# Patient Record
Sex: Male | Born: 1955 | Race: White | Hispanic: No | Marital: Married | Smoking: Never smoker
Health system: Southern US, Community
[De-identification: ages and names within clinical notes are randomized; demographics above are authoritative.]

## PROBLEM LIST (undated history)

## (undated) DIAGNOSIS — D649 Anemia, unspecified: Secondary | ICD-10-CM

## (undated) DIAGNOSIS — E538 Deficiency of other specified B group vitamins: Secondary | ICD-10-CM

## (undated) DIAGNOSIS — H3530 Unspecified macular degeneration: Secondary | ICD-10-CM

## (undated) DIAGNOSIS — IMO0002 Reserved for concepts with insufficient information to code with codable children: Secondary | ICD-10-CM

## (undated) DIAGNOSIS — T7840XA Allergy, unspecified, initial encounter: Secondary | ICD-10-CM

## (undated) DIAGNOSIS — H04123 Dry eye syndrome of bilateral lacrimal glands: Secondary | ICD-10-CM

## (undated) HISTORY — PX: APPENDECTOMY: SHX54

## (undated) HISTORY — DX: Allergy, unspecified, initial encounter: T78.40XA

## (undated) HISTORY — DX: Dry eye syndrome of bilateral lacrimal glands: H04.123

## (undated) HISTORY — DX: Deficiency of other specified B group vitamins: E53.8

## (undated) HISTORY — DX: Anemia, unspecified: D64.9

## (undated) HISTORY — DX: Reserved for concepts with insufficient information to code with codable children: IMO0002

## (undated) HISTORY — DX: Unspecified macular degeneration: H35.30

---

## 2005-02-27 ENCOUNTER — Ambulatory Visit: Payer: Self-pay | Admitting: Oncology

## 2006-03-31 ENCOUNTER — Ambulatory Visit: Payer: Self-pay | Admitting: Oncology

## 2006-04-05 LAB — CBC WITH DIFFERENTIAL/PLATELET
BASO%: 0.3 % (ref 0.0–2.0)
EOS%: 3 % (ref 0.0–7.0)
LYMPH%: 30.2 % (ref 14.0–48.0)
MCHC: 34.7 g/dL (ref 32.0–35.9)
MONO#: 0.5 10*3/uL (ref 0.1–0.9)
RBC: 5.22 10*6/uL (ref 4.20–5.71)
WBC: 4.3 10*3/uL (ref 4.0–10.0)
lymph#: 1.3 10*3/uL (ref 0.9–3.3)

## 2006-04-05 LAB — COMPREHENSIVE METABOLIC PANEL
AST: 25 U/L (ref 0–37)
Albumin: 4.7 g/dL (ref 3.5–5.2)
Alkaline Phosphatase: 72 U/L (ref 39–117)
BUN: 12 mg/dL (ref 6–23)
Potassium: 5.2 mEq/L (ref 3.5–5.3)
Sodium: 144 mEq/L (ref 135–145)
Total Bilirubin: 0.6 mg/dL (ref 0.3–1.2)
Total Protein: 7.2 g/dL (ref 6.0–8.3)

## 2006-04-05 LAB — VITAMIN B12: Vitamin B-12: 455 pg/mL (ref 211–911)

## 2006-05-07 LAB — PSA: PSA: 1.21 ng/mL (ref 0.10–4.00)

## 2006-08-25 ENCOUNTER — Inpatient Hospital Stay (HOSPITAL_COMMUNITY): Admission: EM | Admit: 2006-08-25 | Discharge: 2006-08-26 | Payer: Self-pay | Admitting: Family Medicine

## 2006-08-25 ENCOUNTER — Encounter (INDEPENDENT_AMBULATORY_CARE_PROVIDER_SITE_OTHER): Payer: Self-pay | Admitting: Specialist

## 2007-03-31 ENCOUNTER — Ambulatory Visit: Payer: Self-pay | Admitting: Oncology

## 2007-04-04 LAB — CBC WITH DIFFERENTIAL/PLATELET
Basophils Absolute: 0 10*3/uL (ref 0.0–0.1)
EOS%: 4.2 % (ref 0.0–7.0)
Eosinophils Absolute: 0.2 10*3/uL (ref 0.0–0.5)
HCT: 42.5 % (ref 38.7–49.9)
HGB: 15.1 g/dL (ref 13.0–17.1)
MCH: 30.6 pg (ref 28.0–33.4)
NEUT#: 1.9 10*3/uL (ref 1.5–6.5)
NEUT%: 51.6 % (ref 40.0–75.0)
lymph#: 1.2 10*3/uL (ref 0.9–3.3)

## 2007-04-04 LAB — LIPID PANEL
Cholesterol: 158 mg/dL (ref 0–200)
HDL: 46 mg/dL (ref >39)
LDL Cholesterol: 96 mg/dL (ref 0–99)
Total CHOL/HDL Ratio: 3.4 Ratio
Triglycerides: 80 mg/dL (ref <150)
VLDL: 16 mg/dL (ref 0–40)

## 2007-04-04 LAB — COMPREHENSIVE METABOLIC PANEL
AST: 25 U/L (ref 0–37)
Alkaline Phosphatase: 74 U/L (ref 39–117)
BUN: 15 mg/dL (ref 6–23)
Glucose, Bld: 92 mg/dL (ref 70–99)
Potassium: 4.1 mEq/L (ref 3.5–5.3)
Total Bilirubin: 0.8 mg/dL (ref 0.3–1.2)

## 2008-03-30 ENCOUNTER — Ambulatory Visit: Payer: Self-pay | Admitting: Oncology

## 2008-04-03 LAB — COMPREHENSIVE METABOLIC PANEL
ALT: 53 U/L (ref 0–53)
AST: 33 U/L (ref 0–37)
Alkaline Phosphatase: 67 U/L (ref 39–117)
CO2: 29 mEq/L (ref 19–32)
Sodium: 140 mEq/L (ref 135–145)
Total Bilirubin: 1.3 mg/dL — ABNORMAL HIGH (ref 0.3–1.2)
Total Protein: 6.7 g/dL (ref 6.0–8.3)

## 2008-04-03 LAB — LIPID PANEL
Total CHOL/HDL Ratio: 4.1 Ratio
VLDL: 14 mg/dL (ref 0–40)

## 2008-04-03 LAB — CBC WITH DIFFERENTIAL/PLATELET
BASO%: 0.3 % (ref 0.0–2.0)
LYMPH%: 34.1 % (ref 14.0–48.0)
MCHC: 34.9 g/dL (ref 32.0–35.9)
MONO#: 0.5 10*3/uL (ref 0.1–0.9)
MONO%: 14.9 % — ABNORMAL HIGH (ref 0.0–13.0)
Platelets: 219 10*3/uL (ref 145–400)
RBC: 4.91 10*6/uL (ref 4.20–5.71)
WBC: 3.6 10*3/uL — ABNORMAL LOW (ref 4.0–10.0)

## 2008-05-14 ENCOUNTER — Encounter: Admission: RE | Admit: 2008-05-14 | Discharge: 2008-05-14 | Payer: Self-pay | Admitting: Occupational Medicine

## 2009-03-15 ENCOUNTER — Ambulatory Visit: Payer: Self-pay | Admitting: Oncology

## 2009-03-19 LAB — CBC & DIFF AND RETIC
BASO%: 0.3 % (ref 0.0–2.0)
EOS%: 4.8 % (ref 0.0–7.0)
Immature Retic Fract: 3.6 % (ref 0.00–13.40)
MCH: 29.8 pg (ref 27.2–33.4)
MCHC: 34.3 g/dL (ref 32.0–36.0)
RBC: 5.41 10*6/uL (ref 4.20–5.82)
RDW: 12.9 % (ref 11.0–14.6)
Retic %: 0.77 % (ref 0.50–1.60)
Retic Ct Abs: 41.66 10*3/uL (ref 24.10–77.50)
lymph#: 1.3 10*3/uL (ref 0.9–3.3)

## 2009-03-19 LAB — COMPREHENSIVE METABOLIC PANEL
ALT: 39 U/L (ref 0–53)
AST: 32 U/L (ref 0–37)
Alkaline Phosphatase: 76 U/L (ref 39–117)
CO2: 29 mEq/L (ref 19–32)
Creatinine, Ser: 0.92 mg/dL (ref 0.40–1.50)
Total Bilirubin: 1.1 mg/dL (ref 0.3–1.2)

## 2009-03-19 LAB — PSA: PSA: 2.19 ng/mL (ref 0.10–4.00)

## 2009-03-19 LAB — LIPID PANEL
HDL: 52 mg/dL (ref >39)
Total CHOL/HDL Ratio: 3.7 Ratio

## 2009-09-16 ENCOUNTER — Ambulatory Visit: Payer: Self-pay | Admitting: Oncology

## 2009-09-25 LAB — PSA: PSA: 1.91 ng/mL (ref 0.10–4.00)

## 2010-03-18 ENCOUNTER — Ambulatory Visit: Payer: Self-pay | Admitting: Oncology

## 2010-03-20 LAB — CBC WITH DIFFERENTIAL/PLATELET
Basophils Absolute: 0 10*3/uL (ref 0.0–0.1)
Eosinophils Absolute: 0.2 10*3/uL (ref 0.0–0.5)
HGB: 15.8 g/dL (ref 13.0–17.1)
MCV: 88.4 fL (ref 79.3–98.0)
MONO#: 0.5 10*3/uL (ref 0.1–0.9)
NEUT#: 2.5 10*3/uL (ref 1.5–6.5)
RBC: 5.27 10*6/uL (ref 4.20–5.82)
RDW: 12.9 % (ref 11.0–14.6)
WBC: 4.3 10*3/uL (ref 4.0–10.3)
lymph#: 1.1 10*3/uL (ref 0.9–3.3)

## 2010-03-21 LAB — COMPREHENSIVE METABOLIC PANEL
Albumin: 4.6 g/dL (ref 3.5–5.2)
Alkaline Phosphatase: 80 U/L (ref 39–117)
BUN: 10 mg/dL (ref 6–23)
CO2: 30 mEq/L (ref 19–32)
Calcium: 9.9 mg/dL (ref 8.4–10.5)
Chloride: 103 mEq/L (ref 96–112)
Glucose, Bld: 92 mg/dL (ref 70–99)
Potassium: 5.1 mEq/L (ref 3.5–5.3)
Sodium: 140 mEq/L (ref 135–145)
Total Protein: 7.3 g/dL (ref 6.0–8.3)

## 2010-03-21 LAB — LIPID PANEL
Cholesterol: 166 mg/dL (ref 0–200)
Triglycerides: 81 mg/dL (ref <150)
VLDL: 16 mg/dL (ref 0–40)

## 2010-03-21 LAB — PSA: PSA: 4.13 ng/mL — ABNORMAL HIGH (ref <=4.00)

## 2010-04-18 ENCOUNTER — Ambulatory Visit: Payer: Self-pay | Admitting: Oncology

## 2010-09-26 NOTE — Op Note (Signed)
[name], [name] NO.:  [id number]   MEDICAL RECORD NO.:  [id number]          PATIENT TYPE:  INP   LOCATION:  0098                         FACILITY:  [facility]   PHYSICIAN:  Thornton Park. Daphine Deutscher, MD  DATE OF BIRTH:  1955-10-28   DATE OF PROCEDURE:  08/25/2006  DATE OF DISCHARGE:                               OPERATIVE REPORT   PREOPERATIVE DIAGNOSIS:  Acute suppurative appendicitis.   POSTOPERATIVE DIAGNOSIS:  Acute suppurative appendicitis.   PROCEDURE:  Laparoscopic appendectomy.   SURGEON:  Thornton Park. Daphine Deutscher, M.D.   ASSISTANT:  None.   ANESTHESIA:  General endotracheal.   FINDINGS:  Acute suppurative appendicitis.   DESCRIPTION OF PROCEDURE:  Kyle Gillespie was seen and is a 55 year old  white male with history of pernicious anemia.  He was seen in the [facility] Room for right lower quadrant abdominal pain and on CT  scan was found to have appendicitis.  He was seen by me and taken to the  OR at [facility] on August 25, 2006.  A laparoscopic appendectomy was  performed by going through the umbilicus with Hassan technique without  difficulty and placing a 5 in the right upper quadrant and an 11 in the  left lower quadrant.  I approached the appendix and found it to be a  walled off and suppurative, but no abscess.  I mobilized it.  I used a  harmonic scalpel to go to mesentery, and stapled across the base with an  Endo-GIA with a vascular load.  It was placed in a bag and brought out  through the umbilicus.  I did have to enlarge the umbilical incision a  little bit to get it out since it was so large.  The umbilical fascia  was then repaired with 0 Vicryl, with two sutures, and all ports were  then injected with Marcaine, the abdomen was deflated, and the skin was  closed 4-0 Vicryl.  The patient tolerated the procedure well and was  taken to the recovery room in satisfactory condition.      Thornton Park Daphine Deutscher, MD  Electronically  Signed     MBM/MEDQ  D:  08/25/2006  T:  08/26/2006  Job:  [id number]   cc:   Valentino Hue. [name], M.D.  Fax: [phone]

## 2010-11-21 ENCOUNTER — Other Ambulatory Visit: Payer: Self-pay | Admitting: Oncology

## 2010-11-21 ENCOUNTER — Encounter (HOSPITAL_BASED_OUTPATIENT_CLINIC_OR_DEPARTMENT_OTHER): Payer: BC Managed Care – PPO | Admitting: Oncology

## 2010-11-21 DIAGNOSIS — E039 Hypothyroidism, unspecified: Secondary | ICD-10-CM

## 2010-11-21 DIAGNOSIS — D510 Vitamin B12 deficiency anemia due to intrinsic factor deficiency: Secondary | ICD-10-CM

## 2010-11-21 LAB — COMPREHENSIVE METABOLIC PANEL
Albumin: 4.7 g/dL (ref 3.5–5.2)
BUN: 11 mg/dL (ref 6–23)
CO2: 29 mEq/L (ref 19–32)
Glucose, Bld: 92 mg/dL (ref 70–99)
Potassium: 5 mEq/L (ref 3.5–5.3)
Sodium: 141 mEq/L (ref 135–145)
Total Protein: 7 g/dL (ref 6.0–8.3)

## 2010-11-21 LAB — PSA: PSA: 2.46 ng/mL (ref <=4.00)

## 2010-11-21 LAB — CBC WITH DIFFERENTIAL/PLATELET
Basophils Absolute: 0 10*3/uL (ref 0.0–0.1)
Eosinophils Absolute: 0.1 10*3/uL (ref 0.0–0.5)
HGB: 15.6 g/dL (ref 13.0–17.1)
LYMPH%: 29 % (ref 14.0–49.0)
MONO#: 0.4 10*3/uL (ref 0.1–0.9)
NEUT#: 2 10*3/uL (ref 1.5–6.5)
Platelets: 216 10*3/uL (ref 140–400)
RBC: 5.09 10*6/uL (ref 4.20–5.82)
WBC: 3.5 10*3/uL — ABNORMAL LOW (ref 4.0–10.3)

## 2010-11-21 LAB — VITAMIN B12: Vitamin B-12: 333 pg/mL (ref 211–911)

## 2010-12-22 ENCOUNTER — Encounter (HOSPITAL_BASED_OUTPATIENT_CLINIC_OR_DEPARTMENT_OTHER): Payer: BC Managed Care – PPO | Admitting: Oncology

## 2010-12-22 DIAGNOSIS — E039 Hypothyroidism, unspecified: Secondary | ICD-10-CM

## 2010-12-22 DIAGNOSIS — D510 Vitamin B12 deficiency anemia due to intrinsic factor deficiency: Secondary | ICD-10-CM

## 2011-04-18 ENCOUNTER — Telehealth: Payer: Self-pay | Admitting: Oncology

## 2011-04-18 NOTE — Telephone Encounter (Signed)
per pof 08/3 called pts home lmovm with appts for july2013 and to rtn call to confirm appts

## 2011-04-28 ENCOUNTER — Telehealth: Payer: Self-pay | Admitting: Oncology

## 2011-04-28 NOTE — Telephone Encounter (Signed)
pt called lmovm to changed appt on 07/16 to 07/15.  rtn call lmovm that appt was changed for 07/15 @ 11:30am

## 2011-11-23 ENCOUNTER — Ambulatory Visit (HOSPITAL_BASED_OUTPATIENT_CLINIC_OR_DEPARTMENT_OTHER): Payer: BC Managed Care – PPO | Admitting: Oncology

## 2011-11-23 ENCOUNTER — Encounter: Payer: Self-pay | Admitting: Oncology

## 2011-11-23 ENCOUNTER — Other Ambulatory Visit (HOSPITAL_BASED_OUTPATIENT_CLINIC_OR_DEPARTMENT_OTHER): Payer: BC Managed Care – PPO | Admitting: Lab

## 2011-11-23 ENCOUNTER — Telehealth: Payer: Self-pay | Admitting: *Deleted

## 2011-11-23 VITALS — BP 136/91 | HR 70 | Temp 98.5°F | Ht 72.0 in | Wt 186.1 lb

## 2011-11-23 DIAGNOSIS — D510 Vitamin B12 deficiency anemia due to intrinsic factor deficiency: Secondary | ICD-10-CM

## 2011-11-23 DIAGNOSIS — IMO0002 Reserved for concepts with insufficient information to code with codable children: Secondary | ICD-10-CM

## 2011-11-23 DIAGNOSIS — M5137 Other intervertebral disc degeneration, lumbosacral region: Secondary | ICD-10-CM | POA: Insufficient documentation

## 2011-11-23 DIAGNOSIS — E538 Deficiency of other specified B group vitamins: Secondary | ICD-10-CM

## 2011-11-23 LAB — CBC WITH DIFFERENTIAL/PLATELET
BASO%: 0.1 % (ref 0.0–2.0)
Basophils Absolute: 0 10*3/uL (ref 0.0–0.1)
EOS%: 1.5 % (ref 0.0–7.0)
Eosinophils Absolute: 0.1 10*3/uL (ref 0.0–0.5)
HCT: 47.5 % (ref 38.4–49.9)
HGB: 16.3 g/dL (ref 13.0–17.1)
LYMPH%: 25.5 % (ref 14.0–49.0)
MCH: 30.2 pg (ref 27.2–33.4)
MCHC: 34.2 g/dL (ref 32.0–36.0)
MCV: 88.3 fL (ref 79.3–98.0)
MONO#: 0.4 10*3/uL (ref 0.1–0.9)
MONO%: 9 % (ref 0.0–14.0)
NEUT#: 2.5 10*3/uL (ref 1.5–6.5)
NEUT%: 63.9 % (ref 39.0–75.0)
Platelets: 222 10*3/uL (ref 140–400)
RBC: 5.38 10*6/uL (ref 4.20–5.82)
RDW: 13.5 % (ref 11.0–14.6)
WBC: 4 10*3/uL (ref 4.0–10.3)
lymph#: 1 10*3/uL (ref 0.9–3.3)

## 2011-11-23 LAB — LIPID PANEL
HDL: 48 mg/dL (ref >39)
LDL Cholesterol: 111 mg/dL — ABNORMAL HIGH (ref 0–99)

## 2011-11-23 LAB — COMPREHENSIVE METABOLIC PANEL
ALT: 42 U/L (ref 0–53)
AST: 31 U/L (ref 0–37)
Albumin: 4.8 g/dL (ref 3.5–5.2)
Alkaline Phosphatase: 76 U/L (ref 39–117)
BUN: 13 mg/dL (ref 6–23)
CO2: 29 mEq/L (ref 19–32)
Calcium: 10.1 mg/dL (ref 8.4–10.5)
Chloride: 103 mEq/L (ref 96–112)
Creatinine, Ser: 0.9 mg/dL (ref 0.50–1.35)
Glucose, Bld: 91 mg/dL (ref 70–99)
Potassium: 4.4 mEq/L (ref 3.5–5.3)
Sodium: 141 mEq/L (ref 135–145)
Total Bilirubin: 0.7 mg/dL (ref 0.3–1.2)
Total Protein: 7.3 g/dL (ref 6.0–8.3)

## 2011-11-23 LAB — VITAMIN B12: Vitamin B-12: 329 pg/mL (ref 211–911)

## 2011-11-23 LAB — PSA: PSA: 2.49 ng/mL (ref <=4.00)

## 2011-11-23 NOTE — Telephone Encounter (Signed)
gave patient appointment for 11-28-2012 starting at 11:00am

## 2011-11-23 NOTE — Progress Notes (Signed)
ID: Kyle Gillespie   DOB: May 13, 1955  MR#: [id number]  WUJ#:[id number]   INTERVAL HISTORY: Kyle Gillespie returns for followup of his B12 deficiency. The interval history is generally unremarkable. 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  REVIEW OF SYSTEMS: He has mild chronic low back problems and thinks he pulled something a couple of weeks ago, but that's better. 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 He has mild sinus symptoms. Otherwise a detailed review of systems today was entirely negative.  PAST MEDICAL HISTORY: Past Medical History  Diagnosis Date  . Vitamin B 12 deficiency   . DDD (degenerative disc disease)     PAST SURGICAL HISTORY: Past Surgical History  Procedure Date  . Appendectomy     FAMILY HISTORY The patient's father died after a stroke at the age of 54. The patient's mother is alive, she is 78 as of July of 2013. The patient has 2 brothers and one sister. Is not aware of any cancer or blood problems in the family.  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 His wife of 28 years, the key, is a Diplomatic Services operational officer at the right [employer]. They have no children. The patient is not a church attender.    ADVANCED DIRECTIVES: in place  HEALTH MAINTENANCE: History  Substance Use Topics  . Smoking status: Never Smoker   . Smokeless tobacco: Never Used  . Alcohol Use: No     Colonoscopy: c. 2008  PSA: pending  Lipid panel: pending  Allergies  Allergen Reactions  . Penicillins Rash  . Morphine And Related Other (See Comments)    Severe headache    Current Outpatient Prescriptions  Medication Sig Dispense Refill  . cyanocobalamin (,VITAMIN B-12,) 1000 MCG/ML injection         OBJECTIVE: Middle-aged white male who appears well Filed Vitals:   11/23/11 1139  BP: 136/91    Pulse: 70  Temp: 98.5 F (36.9 C)     Body mass index is 25.24 kg/(m^2).    ECOG FS: 0  Sclerae unicteric Oropharynx clear, no thyromegaly No cervical or supraclavicular adenopathy,  Lungs no rales or rhonchi Heart regular rate and rhythm,  No murmur appreciated Abd soft, nontender, positive bowel sounds, no organomegaly. Rectal exam: Prostate is not enlarged or nodular; stool is guaiac negative. MSK no focal spinal tenderness, no peripheral edema Neuro: nonfocal  LAB RESULTS: Lab Results  Component Value Date   WBC 4.0 11/23/2011   NEUTROABS 2.5 11/23/2011   HGB 16.3 11/23/2011   HCT 47.5 11/23/2011   MCV 88.3 11/23/2011   PLT 222 11/23/2011      Chemistry      Component Value Date/Time   NA 141 11/21/2010 1317   NA 141 11/21/2010 1317   NA 141 11/21/2010 1317   K 5.0 11/21/2010 1317   K 5.0 11/21/2010 1317   K 5.0 11/21/2010 1317   CL 105 11/21/2010 1317   CL 105 11/21/2010 1317   CL 105 11/21/2010 1317   CO2 29 11/21/2010 1317   CO2 29 11/21/2010 1317   CO2 29 11/21/2010 1317   BUN 11 11/21/2010 1317   BUN 11 11/21/2010 1317   BUN 11 11/21/2010 1317   CREATININE 0.87 11/21/2010 1317   CREATININE 0.87 11/21/2010 1317   CREATININE 0.87 11/21/2010 1317  Component Value Date/Time   CALCIUM 9.9 11/21/2010 1317   CALCIUM 9.9 11/21/2010 1317   CALCIUM 9.9 11/21/2010 1317   ALKPHOS 73 11/21/2010 1317   ALKPHOS 73 11/21/2010 1317   ALKPHOS 73 11/21/2010 1317   AST 27 11/21/2010 1317   AST 27 11/21/2010 1317   AST 27 11/21/2010 1317   ALT 34 11/21/2010 1317   ALT 34 11/21/2010 1317   ALT 34 11/21/2010 1317   BILITOT 1.0 11/21/2010 1317   BILITOT 1.0 11/21/2010 1317   BILITOT 1.0 11/21/2010 1317       No results found for this basename: LABCA2    No components found with this basename: LABCA125    No results found for this basename: INR:1;PROTIME:1 in the last 168 hours  Urinalysis No results found for this basename: colorurine,  appearanceur,  labspec,  phurine,  glucoseu,   hgbur,  bilirubinur,  ketonesur,  proteinur,  urobilinogen,  nitrite,  leukocytesur    STUDIES: No results found.  ASSESSMENT: 56 y.o. Kyle Gillespie man with a history of pernicious anemia on chronic B12 supplementation.  PLAN: The plan is to continue B12 at 1000 mcg subcutaneously monthly for the rest of balance life. We are functioning as his primary care physician and he will see me again in one year, with check of his lipid panel and PSA as well as routine lab work. I will send him a letter with a copy of his results as soon as those labs are available. He knows to call for any problems that may develop before the next visit.   Kyle Gillespie C    11/23/2011

## 2011-11-24 ENCOUNTER — Ambulatory Visit: Payer: BC Managed Care – PPO | Admitting: Oncology

## 2011-11-24 ENCOUNTER — Other Ambulatory Visit: Payer: BC Managed Care – PPO | Admitting: Lab

## 2012-03-10 ENCOUNTER — Other Ambulatory Visit: Payer: Self-pay | Admitting: *Deleted

## 2012-03-10 MED ORDER — CYANOCOBALAMIN 1000 MCG/ML IJ SOLN: 1000.0000 ug | INTRAMUSCULAR | Status: DC

## 2012-11-25 ENCOUNTER — Other Ambulatory Visit: Payer: Self-pay | Admitting: *Deleted

## 2012-11-25 DIAGNOSIS — E538 Deficiency of other specified B group vitamins: Secondary | ICD-10-CM

## 2012-11-25 DIAGNOSIS — IMO0002 Reserved for concepts with insufficient information to code with codable children: Secondary | ICD-10-CM

## 2012-11-28 ENCOUNTER — Other Ambulatory Visit (HOSPITAL_BASED_OUTPATIENT_CLINIC_OR_DEPARTMENT_OTHER): Payer: BC Managed Care – PPO | Admitting: Lab

## 2012-11-28 ENCOUNTER — Telehealth: Payer: Self-pay | Admitting: *Deleted

## 2012-11-28 ENCOUNTER — Ambulatory Visit (HOSPITAL_BASED_OUTPATIENT_CLINIC_OR_DEPARTMENT_OTHER): Payer: BC Managed Care – PPO | Admitting: Oncology

## 2012-11-28 VITALS — BP 123/85 | HR 80 | Temp 98.4°F | Resp 20 | Ht 72.0 in | Wt 184.4 lb

## 2012-11-28 DIAGNOSIS — D510 Vitamin B12 deficiency anemia due to intrinsic factor deficiency: Secondary | ICD-10-CM

## 2012-11-28 DIAGNOSIS — E538 Deficiency of other specified B group vitamins: Secondary | ICD-10-CM

## 2012-11-28 DIAGNOSIS — IMO0002 Reserved for concepts with insufficient information to code with codable children: Secondary | ICD-10-CM

## 2012-11-28 DIAGNOSIS — N419 Inflammatory disease of prostate, unspecified: Secondary | ICD-10-CM

## 2012-11-28 LAB — CBC WITH DIFFERENTIAL/PLATELET
BASO%: 0.3 % (ref 0.0–2.0)
EOS%: 3.2 % (ref 0.0–7.0)
MCH: 30 pg (ref 27.2–33.4)
MCHC: 34 g/dL (ref 32.0–36.0)
RDW: 13.1 % (ref 11.0–14.6)
WBC: 4.2 10*3/uL (ref 4.0–10.3)
lymph#: 1.2 10*3/uL (ref 0.9–3.3)

## 2012-11-28 LAB — COMPREHENSIVE METABOLIC PANEL (CC13)
ALT: 53 U/L (ref 0–55)
AST: 36 U/L — ABNORMAL HIGH (ref 5–34)
Albumin: 4.2 g/dL (ref 3.5–5.0)
Calcium: 9.9 mg/dL (ref 8.4–10.4)
Chloride: 105 mEq/L (ref 98–109)
Potassium: 5.3 mEq/L — ABNORMAL HIGH (ref 3.5–5.1)
Sodium: 142 mEq/L (ref 136–145)
Total Protein: 7.4 g/dL (ref 6.4–8.3)

## 2012-11-28 NOTE — Progress Notes (Signed)
ID: Kyle Gillespie   DOB: 05-19-1955  MR#: [id number]  UYQ#:[id number]   INTERVAL HISTORY: Kyle Gillespie returns for followup of his B12 deficiency. The interval history is generally unremarkable. He was laid off again and has not been able to find a job.   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 He has mild low back pain, not more intense or frequent than before. He gives himself the B 12 shots w/o complications. A detailed review of systems was otherwise unremarkable  PAST MEDICAL HISTORY: Past Medical History  Diagnosis Date  . Vitamin B 12 deficiency   . DDD (degenerative disc disease)     PAST SURGICAL HISTORY: Past Surgical History  Procedure Laterality Date  . Appendectomy      FAMILY HISTORY The patient's father died after a stroke at the age of 61. The patient's mother is alive, she is 70 as of July of 2013. The patient has 2 brothers and one sister. Is not aware of any cancer or blood problems in the family.  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 His wife of 28 years, Kyle Gillespie, is a Diplomatic Services operational officer at the [employer]. They have no children. He does have 3 dogs. The patient is not a church attender.    ADVANCED DIRECTIVES: in place  HEALTH MAINTENANCE: History  Substance Use Topics  . Smoking status: Never Smoker   . Smokeless tobacco: Never Used  . Alcohol Use: No     Colonoscopy: Gillespie. 2008  PSA: pending  Lipid panel: pending  Allergies  Allergen Reactions  . Penicillins Rash  . Morphine And Related Other (See Comments)    Severe headache    Current Outpatient Prescriptions  Medication Sig Dispense Refill  . cyanocobalamin (,VITAMIN B-12,) 1000 MCG/ML injection Inject 1 mL (1,000 mcg total) into the muscle every 30 (thirty) days.  1 mL  prn   No current facility-administered medications for this visit.    OBJECTIVE: Middle-aged white male in no acute  distress  Filed Vitals:   11/28/12 1119  BP: 123/85  Pulse: 80  Temp: 98.4 F (36.9 Gillespie)  Resp: 20     Body mass index is 25 kg/(m^2).    ECOG FS: 0  Sclerae unicteric Oropharynx clear No cervical or supraclavicular adenopathy,  Lungs no rales or rhonchi Heart regular rate and rhythm,  No murmur appreciated Abd soft, nontender, positive bowel sounds Rectal exam: Prostate is not enlarged or nodular; stool is guaiac negative. MSK no focal spinal tenderness, no peripheral edema Neuro: nonfocal  LAB RESULTS: Lab Results  Component Value Date   WBC 4.0 11/23/2011   NEUTROABS 2.5 11/23/2011   HGB 16.3 11/23/2011   HCT 47.5 11/23/2011   MCV 88.3 11/23/2011   PLT 222 11/23/2011      Chemistry      Component Value Date/Time   NA 141 11/23/2011 1126   K 4.4 11/23/2011 1126   CL 103 11/23/2011 1126   CO2 29 11/23/2011 1126   BUN 13 11/23/2011 1126   CREATININE 0.90 11/23/2011 1126      Component Value Date/Time   CALCIUM 10.1 11/23/2011 1126   ALKPHOS 76 11/23/2011 1126   AST 31 11/23/2011 1126   ALT 42 11/23/2011 1126   BILITOT 0.7 11/23/2011 1126       No results found for this basename: LABCA2    No components found with this basename: GLOVF643  No results found for this basename: INR,  in the last 168 hours  Urinalysis No results found for this basename: colorurine,  appearanceur,  labspec,  phurine,  glucoseu,  hgbur,  bilirubinur,  ketonesur,  proteinur,  urobilinogen,  nitrite,  leukocytesur    STUDIES: No results found.   ASSESSMENT: 57 y.o. Kyle Gillespie with a history of pernicious anemia, on chronic B12 supplementation.  PLAN: Kyle Gillespie is doing fine from a medical point of view. He will continue to self-administed his B12 shots, and I rewrote his script today. We are functioning as his primary care physician and he will see me again in one year, with check of his lipid panel and PSA as well as routine lab work. I will send him a letter with a copy of his results as soon  as those labs are available. He knows to call for any problems that may develop before the next visit.   Kyle Gillespie,Kyle Gillespie    11/28/2012

## 2012-11-28 NOTE — Telephone Encounter (Signed)
appts made and printed. Pt requested to come in on that [facility] 11/27/13...td

## 2012-11-30 LAB — PSA: PSA: 1.57 ng/mL (ref <=4.00)

## 2012-11-30 LAB — LIPID PANEL: Total CHOL/HDL Ratio: 3.9 Ratio

## 2013-03-16 ENCOUNTER — Other Ambulatory Visit: Payer: Self-pay

## 2013-10-26 ENCOUNTER — Telehealth: Payer: Self-pay | Admitting: *Deleted

## 2013-10-26 NOTE — Telephone Encounter (Signed)
Called pt to r/s f/u with Dr. Darnelle CatalanMagrinat.  Confirmed new date and time for 12/15/13 at 1000 labs/1030 Dr. Darnelle CatalanMagrinat.  Pt denies further needs at this time.

## 2013-11-27 ENCOUNTER — Other Ambulatory Visit: Payer: BC Managed Care – PPO

## 2013-11-27 ENCOUNTER — Ambulatory Visit: Payer: BC Managed Care – PPO | Admitting: Oncology

## 2013-11-29 ENCOUNTER — Telehealth: Payer: Self-pay

## 2013-11-29 NOTE — Telephone Encounter (Signed)
Pt called requesting additional labs added on to 8/7 appt.  He is requesting a check for Hepatitis -C.  He also would like to be checked for tick diseases - reports he has had 3 bites in the last year and is having some pain in his shoulder.  Provided to Dr. Darnelle CatalanMagrinat.

## 2013-12-10 ENCOUNTER — Encounter: Payer: Self-pay | Admitting: Oncology

## 2013-12-11 NOTE — Telephone Encounter (Signed)
Message printed, taken to collaborative nurse.

## 2013-12-15 ENCOUNTER — Other Ambulatory Visit (HOSPITAL_BASED_OUTPATIENT_CLINIC_OR_DEPARTMENT_OTHER): Payer: BC Managed Care – PPO

## 2013-12-15 ENCOUNTER — Telehealth: Payer: Self-pay | Admitting: Oncology

## 2013-12-15 ENCOUNTER — Ambulatory Visit (HOSPITAL_BASED_OUTPATIENT_CLINIC_OR_DEPARTMENT_OTHER): Payer: BC Managed Care – PPO | Admitting: Oncology

## 2013-12-15 VITALS — BP 132/76 | HR 83 | Temp 98.1°F | Resp 18 | Ht 72.0 in | Wt 192.0 lb

## 2013-12-15 DIAGNOSIS — D649 Anemia, unspecified: Secondary | ICD-10-CM

## 2013-12-15 DIAGNOSIS — D510 Vitamin B12 deficiency anemia due to intrinsic factor deficiency: Secondary | ICD-10-CM

## 2013-12-15 DIAGNOSIS — M5032 Other cervical disc degeneration, mid-cervical region, unspecified level: Secondary | ICD-10-CM

## 2013-12-15 DIAGNOSIS — E538 Deficiency of other specified B group vitamins: Secondary | ICD-10-CM

## 2013-12-15 LAB — CBC WITH DIFFERENTIAL/PLATELET
BASO%: 0.5 % (ref 0.0–2.0)
Basophils Absolute: 0 10*3/uL (ref 0.0–0.1)
EOS%: 3.7 % (ref 0.0–7.0)
Eosinophils Absolute: 0.2 10*3/uL (ref 0.0–0.5)
HCT: 45 % (ref 38.4–49.9)
HGB: 15.2 g/dL (ref 13.0–17.1)
LYMPH%: 29.6 % (ref 14.0–49.0)
MCH: 30 pg (ref 27.2–33.4)
MCHC: 33.7 g/dL (ref 32.0–36.0)
MCV: 89 fL (ref 79.3–98.0)
MONO#: 0.5 10*3/uL (ref 0.1–0.9)
MONO%: 12.8 % (ref 0.0–14.0)
NEUT#: 2.2 10*3/uL (ref 1.5–6.5)
NEUT%: 53.4 % (ref 39.0–75.0)
PLATELETS: 199 10*3/uL (ref 140–400)
RBC: 5.06 10*6/uL (ref 4.20–5.82)
RDW: 12.9 % (ref 11.0–14.6)
WBC: 4.1 10*3/uL (ref 4.0–10.3)
lymph#: 1.2 10*3/uL (ref 0.9–3.3)

## 2013-12-15 LAB — COMPREHENSIVE METABOLIC PANEL (CC13)
ALBUMIN: 4 g/dL (ref 3.5–5.0)
ALK PHOS: 87 U/L (ref 40–150)
ALT: 45 U/L (ref 0–55)
AST: 34 U/L (ref 5–34)
Anion Gap: 6 mEq/L (ref 3–11)
BUN: 9.8 mg/dL (ref 7.0–26.0)
CALCIUM: 9.5 mg/dL (ref 8.4–10.4)
CO2: 31 mEq/L — ABNORMAL HIGH (ref 22–29)
CREATININE: 0.9 mg/dL (ref 0.7–1.3)
Chloride: 106 mEq/L (ref 98–109)
Glucose: 92 mg/dl (ref 70–140)
POTASSIUM: 5.3 meq/L — AB (ref 3.5–5.1)
Sodium: 142 mEq/L (ref 136–145)
Total Bilirubin: 0.87 mg/dL (ref 0.20–1.20)
Total Protein: 6.9 g/dL (ref 6.4–8.3)

## 2013-12-15 MED ORDER — CYANOCOBALAMIN 1000 MCG/ML IJ SOLN: 1000.0000 ug | INTRAMUSCULAR | Status: DC

## 2013-12-15 NOTE — Telephone Encounter (Signed)
per pof to [facility] appt-sch & gave pt copy of sch

## 2013-12-15 NOTE — Progress Notes (Signed)
ID: [name]   DOB: 1955/08/05  MR#: [id number]  WUJ#:[id number]  PCP: No primary provider on file. GYN: SU:  OTHER MD: Judi Cong MD  INTERVAL HISTORY: Zakiah returns for followup of his B12 deficiency. The interval history is generally unremarkable. 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  REVIEW OF SYSTEMS: He developed progressive right upper extremity weakness and grip weakness on the right. He was evaluated by Dr. Liliane Shi in [place] will try to obtain those records. Freida Busman tells me she eventually got a shot in his shoulder, and gradually over the last 3 months the right arm has pretty much gone back to normal. Aside from that he has had multiple tick bites, mostly dear takes, so he is concerned whether he might be developing Lyme disease or other tickborne illnesses. He never had a target lesion or a significant rash with any of the tick bites. He does have pain in his right knee and he has not felt all that well for the last couple of weeks. He has not had a fever. There has been no change in bowel or bladder habits. He has a good urine stream. He feels a little forgetful at times. He has no trouble giving himself the B12 shots. Otherwise a detailed review of systems today was noncontributory  PAST MEDICAL HISTORY: Past Medical History  Diagnosis Date  . Vitamin B 12 deficiency   . DDD (degenerative disc disease)     PAST SURGICAL HISTORY: Past Surgical History  Procedure Laterality Date  . Appendectomy      FAMILY HISTORY The patient's father died after a stroke at the age of 21. The patient's mother is alive, she is 69 as of July of 2013. The patient has 2 brothers and one sister. Is not aware of any cancer or blood problems in the family.  SOCIAL HISTORY: Mannie worked in a Field seismologist. His wife of 28 years, [name], is a Diplomatic Services operational officer at the [employer]. They have no children. He  does have 3 dogs. The patient is not a church attender.    ADVANCED DIRECTIVES: in place  HEALTH MAINTENANCE: History  Substance Use Topics  . Smoking status: Never Smoker   . Smokeless tobacco: Never Used  . Alcohol Use: No     Colonoscopy: c. 2008  PSA: pending  Lipid panel: pending  Allergies  Allergen Reactions  . Penicillins Rash  . Morphine And Related Other (See Comments)    Severe headache    Current Outpatient Prescriptions  Medication Sig Dispense Refill  . cyanocobalamin (,VITAMIN B-12,) 1000 MCG/ML injection Inject 1 mL (1,000 mcg total) into the muscle every 30 (thirty) days.  1 mL  prn   No current facility-administered medications for this visit.    OBJECTIVE: Middle-aged white man who appears stated age 37 Vitals:   12/15/13 1014  BP: 132/76  Pulse: 83  Temp: 98.1 F (36.7 C)  Resp: 18     Body mass index is 26.03 kg/(m^2).    ECOG FS: 0  Sclerae unicteric, pupils round and reactive Oropharynx clear. Tongue slightly coated No cervical or supraclavicular adenopathy,  Lungs no rales or rhonchi Heart regular rate and rhythm,  No murmur appreciated Abd soft, nontender, positive bowel sounds Rectal exam: Prostate is flat; stool is guaiac positive MSK no focal spinal tenderness, no peripheral edema; neck motion is normal with no crepitus or pain; knee exam is unremarkable  Neuro: Right  upper extremity strength is 5 over 5 for abduction as well as flexion and extension, grip is 5 over 5. There is no sensory level;  LAB RESULTS: Lab Results  Component Value Date   WBC 4.1 12/15/2013   NEUTROABS 2.2 12/15/2013   HGB 15.2 12/15/2013   HCT 45.0 12/15/2013   MCV 89.0 12/15/2013   PLT 199 12/15/2013      Chemistry      Component Value Date/Time   NA 142 12/15/2013 0956   NA 141 11/23/2011 1126   K 5.3* 12/15/2013 0956   K 4.4 11/23/2011 1126   CL 103 11/23/2011 1126   CO2 31* 12/15/2013 0956   CO2 29 11/23/2011 1126   BUN 9.8 12/15/2013 0956   BUN 13 11/23/2011  1126   CREATININE 0.9 12/15/2013 0956   CREATININE 0.90 11/23/2011 1126      Component Value Date/Time   CALCIUM 9.5 12/15/2013 0956   CALCIUM 10.1 11/23/2011 1126   ALKPHOS 87 12/15/2013 0956   ALKPHOS 76 11/23/2011 1126   AST 34 12/15/2013 0956   AST 31 11/23/2011 1126   ALT 45 12/15/2013 0956   ALT 42 11/23/2011 1126   BILITOT 0.87 12/15/2013 0956   BILITOT 0.7 11/23/2011 1126       No results found for this basename: LABCA2    No components found with this basename: LABCA125    No results found for this basename: INR,  in the last 168 hours  Urinalysis No results found for this basename: colorurine,  appearanceur,  labspec,  phurine,  glucoseu,  hgbur,  bilirubinur,  ketonesur,  proteinur,  urobilinogen,  nitrite,  leukocytesur    STUDIES: No results found.   ASSESSMENT: 58 y.o. [name] with a history of pernicious anemia, on chronic B12 supplementation.  (1) multiple tick bites  (2) guaiac-positive stool  PLAN:  We [place] [place] weakness, but that appears to have resolved. While he had multiple tick bites, he has a normal platelet count, has had no systemic symptoms (aside from mild malaise the last week or 2) and never had a rash. He does have guaiac-positive stool, with no change in bowel habits. I am referring him back to Dr. Randa EvensEdwards for further GI evaluation, likely to include colonoscopy.  Otherwise [name] will return to see me in a year. We will repeat lab work before that visit. He knows to call for any problems that may develop before then.  Brysyn Brandenberger C    12/15/2013

## 2013-12-16 LAB — HIV ANTIBODY (ROUTINE TESTING W REFLEX): HIV 1&2 Ab, 4th Generation: NONREACTIVE

## 2013-12-16 LAB — PSA: PSA: 2.36 ng/mL (ref <=4.00)

## 2013-12-16 LAB — HEPATITIS B SURFACE ANTIGEN: Hepatitis B Surface Ag: NEGATIVE

## 2013-12-16 LAB — HEPATITIS C ANTIBODY: HCV AB: NEGATIVE

## 2013-12-16 LAB — HEPATITIS B CORE ANTIBODY, IGM: HEP B C IGM: NONREACTIVE

## 2013-12-18 ENCOUNTER — Telehealth: Payer: Self-pay

## 2013-12-18 NOTE — Telephone Encounter (Signed)
Faxed records request to Dr. Andria MeuseStevens.  Sent to scan.

## 2013-12-20 ENCOUNTER — Telehealth: Payer: Self-pay

## 2013-12-20 NOTE — Telephone Encounter (Signed)
Rcvd by fax medical records rom [facility] Dr. Liliane ShiWinter.  Copy to [name].  Original to scan.

## 2013-12-31 ENCOUNTER — Other Ambulatory Visit: Payer: Self-pay | Admitting: Oncology

## 2014-04-12 ENCOUNTER — Ambulatory Visit (INDEPENDENT_AMBULATORY_CARE_PROVIDER_SITE_OTHER): Payer: BC Managed Care – PPO | Admitting: *Deleted

## 2014-04-12 DIAGNOSIS — Z23 Encounter for immunization: Secondary | ICD-10-CM

## 2014-04-12 NOTE — Progress Notes (Signed)
Tdap and flu vaccine given and tolerated well.

## 2014-04-12 NOTE — Patient Instructions (Signed)

## 2014-05-23 ENCOUNTER — Ambulatory Visit (INDEPENDENT_AMBULATORY_CARE_PROVIDER_SITE_OTHER): Payer: BC Managed Care – PPO | Admitting: Family Medicine

## 2014-05-23 ENCOUNTER — Encounter: Payer: Self-pay | Admitting: Family Medicine

## 2014-05-23 VITALS — BP 147/90 | HR 93 | Temp 97.9°F | Ht 72.0 in | Wt 195.0 lb

## 2014-05-23 DIAGNOSIS — R34 Anuria and oliguria: Secondary | ICD-10-CM

## 2014-05-23 LAB — POCT UA - MICROSCOPIC ONLY
Casts, Ur, LPF, POC: NEGATIVE
Crystals, Ur, HPF, POC: NEGATIVE
Mucus, UA: NEGATIVE
Yeast, UA: NEGATIVE

## 2014-05-23 LAB — POCT URINALYSIS DIPSTICK
Bilirubin, UA: NEGATIVE
Glucose, UA: NEGATIVE
Ketones, UA: NEGATIVE
Leukocytes, UA: NEGATIVE
Nitrite, UA: NEGATIVE
Protein, UA: NEGATIVE
Spec Grav, UA: 1.01
Urobilinogen, UA: NEGATIVE
pH, UA: 6

## 2014-05-23 NOTE — Progress Notes (Signed)
   Subjective:    Patient ID: Kyle Gillespie, male    DOB: 11/09/1955, 59 y.o.   MRN: [id number]  HPI Patient is here with c/o having low urine output.  He states his urine was dark and urine output is scanty.  He states he has been drinking more water and fluid over the last few days and he states his urine output is not as much as his intake.  Review of Systems  Constitutional: Negative for fever.  HENT: Negative for ear pain.   Eyes: Negative for discharge.  Respiratory: Negative for cough.   Cardiovascular: Negative for chest pain.  Gastrointestinal: Negative for abdominal distention.  Endocrine: Negative for polyuria.  Genitourinary: Negative for difficulty urinating.  Musculoskeletal: Negative for gait problem and neck pain.  Skin: Negative for color change and rash.  Neurological: Negative for speech difficulty and headaches.  Psychiatric/Behavioral: Negative for agitation.       Objective:    BP 147/90 mmHg  Pulse 93  Temp(Src) 97.9 F (36.6 C) (Oral)  Ht 6' (1.829 m)  Wt 195 lb (88.451 kg)  BMI 26.44 kg/m2 Physical Exam  Constitutional: He is oriented to person, place, and time. He appears well-developed and well-nourished.  HENT:  Head: Normocephalic and atraumatic.  Mouth/Throat: Oropharynx is clear and moist.  Eyes: Pupils are equal, round, and reactive to light.  Neck: Normal range of motion. Neck supple.  Cardiovascular: Normal rate and regular rhythm.   No murmur heard. Pulmonary/Chest: Effort normal and breath sounds normal.  Abdominal: Soft. Bowel sounds are normal. There is no tenderness.  Neurological: He is alert and oriented to person, place, and time.  Skin: Skin is warm and dry.  Psychiatric: He has a normal mood and affect.     Results for orders placed or performed in visit on 05/23/14  POCT urinalysis dipstick  Result Value Ref Range   Color, UA yellow    Clarity, UA clear    Glucose, UA negative    Bilirubin, UA negative    Ketones, UA  negative    Spec Grav, UA 1.010    Blood, UA trace    pH, UA 6.0    Protein, UA negative    Urobilinogen, UA negative    Nitrite, UA negative    Leukocytes, UA Negative   POCT UA - Microscopic Only  Result Value Ref Range   WBC, Ur, HPF, POC occ    RBC, urine, microscopic 1-3    Bacteria, U Microscopic occ    Mucus, UA negative    Epithelial cells, urine per micros few    Crystals, Ur, HPF, POC negative    Casts, Ur, LPF, POC negative    Yeast, UA negative        Assessment & Plan:     ICD-9-CM ICD-10-CM   1. Scanty urination 788.5 R34 POCT urinalysis dipstick     POCT UA - Microscopic Only     No Follow-up on file.  Deatra CanterWilliam J Loghan Subia FNP

## 2014-05-30 ENCOUNTER — Telehealth: Payer: Self-pay | Admitting: Oncology

## 2014-05-30 NOTE — Telephone Encounter (Signed)
perpof to sch pt consult w/Dr Silvio ClaymanEdwards-cld Edwards office receptionist stated appt was CX by pt because pt stated insurance purposes

## 2014-12-14 ENCOUNTER — Other Ambulatory Visit: Payer: Self-pay | Admitting: *Deleted

## 2014-12-14 ENCOUNTER — Other Ambulatory Visit (HOSPITAL_BASED_OUTPATIENT_CLINIC_OR_DEPARTMENT_OTHER): Payer: BC Managed Care – PPO

## 2014-12-14 DIAGNOSIS — E538 Deficiency of other specified B group vitamins: Secondary | ICD-10-CM

## 2014-12-14 DIAGNOSIS — D510 Vitamin B12 deficiency anemia due to intrinsic factor deficiency: Secondary | ICD-10-CM

## 2014-12-14 LAB — COMPREHENSIVE METABOLIC PANEL (CC13)
ALBUMIN: 4.4 g/dL (ref 3.5–5.0)
ALT: 48 U/L (ref 0–55)
AST: 33 U/L (ref 5–34)
Alkaline Phosphatase: 88 U/L (ref 40–150)
Anion Gap: 4 mEq/L (ref 3–11)
BILIRUBIN TOTAL: 1.37 mg/dL — AB (ref 0.20–1.20)
BUN: 11.8 mg/dL (ref 7.0–26.0)
CALCIUM: 9.6 mg/dL (ref 8.4–10.4)
CHLORIDE: 109 meq/L (ref 98–109)
CO2: 28 mEq/L (ref 22–29)
CREATININE: 0.8 mg/dL (ref 0.7–1.3)
EGFR: >90 mL/min/{1.73_m2} (ref >90)
Glucose: 90 mg/dl (ref 70–140)
Potassium: 4.2 mEq/L (ref 3.5–5.1)
Sodium: 142 mEq/L (ref 136–145)
TOTAL PROTEIN: 7.2 g/dL (ref 6.4–8.3)

## 2014-12-14 LAB — CBC WITH DIFFERENTIAL/PLATELET
BASO%: 0 % (ref 0.0–2.0)
BASOS ABS: 0 10*3/uL (ref 0.0–0.1)
EOS ABS: 0.1 10*3/uL (ref 0.0–0.5)
EOS%: 2.5 % (ref 0.0–7.0)
HCT: 44.6 % (ref 38.4–49.9)
HEMOGLOBIN: 15.8 g/dL (ref 13.0–17.1)
LYMPH#: 1.4 10*3/uL (ref 0.9–3.3)
LYMPH%: 34.4 % (ref 14.0–49.0)
MCH: 30.7 pg (ref 27.2–33.4)
MCHC: 35.4 g/dL (ref 32.0–36.0)
MCV: 86.6 fL (ref 79.3–98.0)
MONO#: 0.4 10*3/uL (ref 0.1–0.9)
MONO%: 9.9 % (ref 0.0–14.0)
NEUT#: 2.2 10*3/uL (ref 1.5–6.5)
NEUT%: 53.2 % (ref 39.0–75.0)
Platelets: 182 10*3/uL (ref 140–400)
RBC: 5.15 10*6/uL (ref 4.20–5.82)
RDW: 12.8 % (ref 11.0–14.6)
WBC: 4 10*3/uL (ref 4.0–10.3)

## 2014-12-15 LAB — PSA: PSA: 1.85 ng/mL (ref <=4.00)

## 2014-12-15 LAB — VITAMIN B12: VITAMIN B 12: 373 pg/mL (ref 211–911)

## 2014-12-18 ENCOUNTER — Emergency Department (HOSPITAL_COMMUNITY)
Admission: EM | Admit: 2014-12-18 | Discharge: 2014-12-18 | Disposition: A | Discharge status: Home / Self Care | Payer: BC Managed Care – PPO | Attending: Emergency Medicine | Admitting: Emergency Medicine

## 2014-12-18 ENCOUNTER — Ambulatory Visit (INDEPENDENT_AMBULATORY_CARE_PROVIDER_SITE_OTHER): Payer: BC Managed Care – PPO

## 2014-12-18 ENCOUNTER — Encounter: Payer: Self-pay | Admitting: Family

## 2014-12-18 ENCOUNTER — Emergency Department (HOSPITAL_COMMUNITY): Payer: BC Managed Care – PPO

## 2014-12-18 ENCOUNTER — Ambulatory Visit (INDEPENDENT_AMBULATORY_CARE_PROVIDER_SITE_OTHER): Payer: BC Managed Care – PPO | Admitting: Family

## 2014-12-18 VITALS — BP 153/89 | HR 75 | Temp 96.8°F | Ht 72.0 in | Wt 188.8 lb

## 2014-12-18 DIAGNOSIS — R109 Unspecified abdominal pain: Secondary | ICD-10-CM

## 2014-12-18 DIAGNOSIS — Z8739 Personal history of other diseases of the musculoskeletal system and connective tissue: Secondary | ICD-10-CM | POA: Insufficient documentation

## 2014-12-18 DIAGNOSIS — N200 Calculus of kidney: Secondary | ICD-10-CM

## 2014-12-18 DIAGNOSIS — M545 Low back pain, unspecified: Secondary | ICD-10-CM

## 2014-12-18 DIAGNOSIS — Z8669 Personal history of other diseases of the nervous system and sense organs: Secondary | ICD-10-CM | POA: Insufficient documentation

## 2014-12-18 DIAGNOSIS — Z880 Allergy status to penicillin: Secondary | ICD-10-CM | POA: Insufficient documentation

## 2014-12-18 DIAGNOSIS — Z8639 Personal history of other endocrine, nutritional and metabolic disease: Secondary | ICD-10-CM | POA: Diagnosis not present

## 2014-12-18 LAB — POCT URINALYSIS DIPSTICK
Bilirubin, UA: NEGATIVE
Glucose, UA: NEGATIVE
Ketones, UA: NEGATIVE
Nitrite, UA: NEGATIVE
SPEC GRAV UA: 1.025
Urobilinogen, UA: NEGATIVE
pH, UA: 6

## 2014-12-18 LAB — URINALYSIS, ROUTINE W REFLEX MICROSCOPIC
Bilirubin Urine: NEGATIVE
GLUCOSE, UA: NEGATIVE mg/dL
Ketones, ur: 15 mg/dL — AB
Leukocytes, UA: NEGATIVE
NITRITE: NEGATIVE
PROTEIN: 100 mg/dL — AB
Specific Gravity, Urine: 1.025 (ref 1.005–1.030)
UROBILINOGEN UA: 4 mg/dL — AB (ref 0.0–1.0)
pH: 6 (ref 5.0–8.0)

## 2014-12-18 LAB — POCT UA - MICROSCOPIC ONLY
Casts, Ur, LPF, POC: NEGATIVE
Crystals, Ur, HPF, POC: NEGATIVE
YEAST UA: NEGATIVE

## 2014-12-18 LAB — URINE MICROSCOPIC-ADD ON

## 2014-12-18 MED ORDER — PROMETHAZINE HCL 25 MG PO TABS: 25.0000 mg | ORAL_TABLET | Freq: Four times a day (QID) | ORAL | Status: DC | PRN

## 2014-12-18 MED ORDER — HYDROCODONE-ACETAMINOPHEN 5-325 MG PO TABS: 1.0000 | ORAL_TABLET | Freq: Four times a day (QID) | ORAL | Status: DC | PRN

## 2014-12-18 MED ORDER — TAMSULOSIN HCL 0.4 MG PO CAPS: 0.4000 mg | ORAL_CAPSULE | Freq: Every day | ORAL | Status: DC

## 2014-12-18 MED ORDER — KETOROLAC TROMETHAMINE 60 MG/2ML IM SOLN
60.0000 mg | Freq: Once | INTRAMUSCULAR | Status: AC
  Administered 2014-12-18: 60 mg via INTRAMUSCULAR

## 2014-12-18 NOTE — Patient Instructions (Signed)

## 2014-12-18 NOTE — ED Notes (Signed)
Patient sent here from [facility] for eval for possible kidney stone. Patient c/o left flank pain and reports hematuria.

## 2014-12-18 NOTE — ED Notes (Signed)
Patient reports having tramadol from [place] and Lortab approx 30 min. Ago.

## 2014-12-18 NOTE — Progress Notes (Signed)
   Subjective:    Patient ID: Kyle Gillespie, male    DOB: [birth year], 57 y.o.   MRN: [id number]  Back Pain This is a new problem. The current episode started in the past 7 days (Friday). The problem occurs constantly. The problem is unchanged. The pain is present in the lumbar spine (left lower back). The quality of the pain is described as aching. Radiates to: left abd and left groin at times. The pain is at a severity of 10/10. The pain is moderate. Associated symptoms include perianal numbness. Pertinent negatives include no bladder incontinence, bowel incontinence, dysuria, fever, leg pain, numbness, tingling or weakness. He has tried NSAIDs, heat, bed rest and ice for the symptoms. The treatment provided mild relief.      Review of Systems  Constitutional: Negative.  Negative for fever.  HENT: Negative.   Respiratory: Negative.   Cardiovascular: Negative.   Gastrointestinal: Negative.  Negative for bowel incontinence.  Endocrine: Negative.   Genitourinary: Negative.  Negative for bladder incontinence and dysuria.  Musculoskeletal: Positive for back pain.  Neurological: Negative.  Negative for tingling, weakness and numbness.  Hematological: Negative.   Psychiatric/Behavioral: Negative.   All other systems reviewed and are negative.      Objective:   Physical Exam  Constitutional: He is oriented to person, place, and time. He appears well-developed and well-nourished. No distress.  Pt's visibly uncomfortable and in pain  HENT:  Head: Normocephalic.  Right Ear: External ear normal.  Left Ear: External ear normal.  Nose: Nose normal.  Mouth/Throat: Oropharynx is clear and moist.  Eyes: Pupils are equal, round, and reactive to light. Right eye exhibits no discharge. Left eye exhibits no discharge.  Neck: Normal range of motion. Neck supple. No thyromegaly present.  Cardiovascular: Normal rate, regular rhythm, normal heart sounds and intact distal pulses.   No murmur  heard. Pulmonary/Chest: Effort normal and breath sounds normal. No respiratory distress. He has no wheezes.  Abdominal: Soft. Bowel sounds are normal. He exhibits no distension. There is no tenderness.  Musculoskeletal: Normal range of motion. He exhibits tenderness (left side lower back). He exhibits no edema.  Neurological: He is alert and oriented to person, place, and time. He has normal reflexes. No cranial nerve deficit.  Skin: Skin is warm. No rash noted. He is diaphoretic. No erythema. There is pallor.  Psychiatric: He has a normal mood and affect. His behavior is normal. Judgment and thought content normal.  Vitals reviewed.     BP 153/89 mmHg  Pulse 75  Temp(Src) 96.8 F (36 C) (Oral)  Ht 6' (1.829 m)  Wt 188 lb 12.8 oz (85.639 kg)  BMI 25.60 kg/m2     Assessment & Plan:  1. Left-sided low back pain without sciatica - POCT urinalysis dipstick - POCT UA - Microscopic Only  2. Kidney stone -Force fluids - HYDROcodone-acetaminophen (NORCO/VICODIN) 5-325 MG per tablet; Take 1 tablet by mouth every 6 (six) hours as needed for moderate pain.  Dispense: 60 tablet; Refill: 0 - CT RENAL STONE STUDY; Future - ketorolac (TORADOL) injection 60 mg; Inject 2 mLs (60 mg total) into the muscle once. - DG Abd 1 View; Future  RTO on Monday- Pt told if pain worsens to go to ED  Jannifer Rodney, FNP

## 2014-12-18 NOTE — ED Provider Notes (Signed)
CSN: [id number]     Arrival date & time 12/18/14  1218 History   First MD Initiated Contact with Patient 12/18/14 1229     Chief Complaint  Patient presents with  . Nephrolithiasis     (Consider location/radiation/quality/duration/timing/severity/associated sxs/prior Treatment) HPI..... Left flank pain for several days with radiation to left lower quadrant. No dysuria, hematuria, fever, chills. Previous medical history of kidney stones. Severity is moderate. Patient went to primary care office today. Pain medicine was given. He was instructed to go the emergency department.  Past Medical History  Diagnosis Date  . Vitamin B 12 deficiency   . DDD (degenerative disc disease)   . Macular degeneration     stage 1  . Dry eyes    Past Surgical History  Procedure Laterality Date  . Appendectomy     No family history on file. History  Substance Use Topics  . Smoking status: Never Smoker   . Smokeless tobacco: Never Used  . Alcohol Use: No    Review of Systems  All other systems reviewed and are negative.     Allergies  Penicillins and Morphine and related  Home Medications   Prior to Admission medications   Medication Sig Start Date End Date Taking? Authorizing Provider  cyanocobalamin (,VITAMIN B-12,) 1000 MCG/ML injection Inject 1 mL (1,000 mcg total) into the muscle every 30 (thirty) days. 12/15/13  Yes Lowella Dell, MD  cycloSPORINE (RESTASIS) 0.05 % ophthalmic emulsion Place 1 drop into both eyes daily.   Yes Historical Provider, MD  HYDROcodone-acetaminophen (NORCO/VICODIN) 5-325 MG per tablet Take 1 tablet by mouth every 6 (six) hours as needed for moderate pain. 12/18/14  Yes Junie Spencer, FNP  ibuprofen (ADVIL,MOTRIN) 200 MG tablet Take 200 mg by mouth every 6 (six) hours as needed.   Yes Historical Provider, MD  ketorolac (TORADOL) 60 MG/2ML SOLN injection Inject 60 mg into the vein once.   Yes Historical Provider, MD  Multiple Vitamins-Minerals (VISION VITAMINS  PO) Take by mouth.   Yes Historical Provider, MD  Polyvinyl Alcohol-Povidone (REFRESH OP) Apply to eye.   Yes Historical Provider, MD  promethazine (PHENERGAN) 25 MG tablet Take 1 tablet (25 mg total) by mouth every 6 (six) hours as needed. 12/18/14   Donnetta Hutching, MD  tamsulosin (FLOMAX) 0.4 MG CAPS capsule Take 1 capsule (0.4 mg total) by mouth daily. 12/18/14   Donnetta Hutching, MD   BP 146/86 mmHg  Pulse 90  Temp(Src) 99.1 F (37.3 C) (Oral)  Resp 16  Ht 6' (1.829 m)  Wt 188 lb (85.276 kg)  BMI 25.49 kg/m2  SpO2 95% Physical Exam  Constitutional: He is oriented to person, place, and time. He appears well-developed and well-nourished.  HENT:  Head: Normocephalic and atraumatic.  Eyes: Conjunctivae and EOM are normal. Pupils are equal, round, and reactive to light.  Neck: Normal range of motion. Neck supple.  Cardiovascular: Normal rate and regular rhythm.   Pulmonary/Chest: Effort normal and breath sounds normal.  Abdominal: Soft. Bowel sounds are normal.  Genitourinary:  Minimal tenderness left flank  Musculoskeletal: Normal range of motion.  Neurological: He is alert and oriented to person, place, and time.  Skin: Skin is warm and dry.  Psychiatric: He has a normal mood and affect. His behavior is normal.  Nursing note and vitals reviewed.   ED Course  Procedures (including critical care time) Labs Review Labs Reviewed  URINALYSIS, ROUTINE W REFLEX MICROSCOPIC (NOT AT [facility]) - Abnormal; Notable for the following:  Hgb urine dipstick LARGE (*)    Ketones, ur 15 (*)    Protein, ur 100 (*)    Urobilinogen, UA 4.0 (*)    All other components within normal limits  URINE MICROSCOPIC-ADD ON    Imaging Review Dg Abd 1 View  12/18/2014   CLINICAL DATA:  Kidney stone.  EXAM: ABDOMEN - 1 VIEW  COMPARISON:  CT abdomen 08/25/2006.  FINDINGS: A 5 mm calcification projects over the lower pole right renal outline. No definite radiopaque calculi project over the left renal outline or expected  course of the ureters bilaterally. Phleboliths in the anatomic pelvis.  IMPRESSION: Right renal stone.   Electronically Signed   By: Leanna Battles M.D.   On: 12/18/2014 12:54   Ct Renal Stone Study  12/18/2014   CLINICAL DATA:  59 year old male with left flank pain since Friday and microscopic hematuria today. Initial encounter.  EXAM: CT ABDOMEN AND PELVIS WITHOUT CONTRAST  TECHNIQUE: Multidetector CT imaging of the abdomen and pelvis was performed following the standard protocol without IV contrast.  COMPARISON:  CT Abdomen and Pelvis 08/25/2006.  FINDINGS: Negative lung bases.  No pericardial or pleural effusion.  Chronic degenerative changes in the spine. No acute osseous abnormality identified.  Stable sequelae of vasectomy. No pelvic free fluid. Chronic pelvic phleboliths. Redundant sigmoid. Largely decompressed distal large bowel. Minimal distal colon diverticula. Retained stool proximal to the splenic flexure. Sequelae of appendectomy. Negative terminal ileum. No dilated small bowel. Decompressed stomach and duodenum.  Negative non contrast liver, gallbladder, spleen, pancreas, and adrenal glands. No abdominal free fluid. No lymphadenopathy.  Several stones measuring up to 5 mm within the right kidney. No right hydronephrosis or hydroureter. Negative course of the right ureter. Unremarkable bladder. On the left there is perinephric stranding and mild hydronephrosis. There is no intra renal calculus on the left. Left periureteral stranding continues into the pelvis where a 3 mm calculus is located within 15 mm of the left ureterovesical junction. No stranding about the ureter distal to this stone.  IMPRESSION: 1. Acute obstructive uropathy on the left. 3 mm distal left ureteral calculus located about 15 mm proximal to the left UVJ. 2. Right nephrolithiasis.   Electronically Signed   By: Odessa Fleming M.D.   On: 12/18/2014 13:59     EKG Interpretation None      MDM   Final diagnoses:  Left flank pain   Kidney stone on left side    No acute abdomen. CT renal study shows a 3 mm distal left ureteral calculus just proximal to the UVJ. Patient has prescription for pain medicine. I gave him prescription for Phenergan 25 mg and Flomax 0.4 mg.  Referral to urology. Discussed with patient and his wife    Donnetta Hutching, MD 12/18/14 [phone]

## 2014-12-18 NOTE — ED Notes (Signed)
MD at bedside. 

## 2014-12-18 NOTE — Discharge Instructions (Signed)
You have a 3 mm kidney stone. Prescription for nausea medicine and medication to increase your urinary flow. Follow-up with urologist if pain persists or go to [facility] emergency department. Phone number for urologist given.Marland Kitchen

## 2014-12-24 ENCOUNTER — Ambulatory Visit: Payer: BC Managed Care – PPO | Admitting: Nurse Practitioner

## 2015-01-02 ENCOUNTER — Encounter: Payer: Self-pay | Admitting: Nurse Practitioner

## 2015-01-02 ENCOUNTER — Ambulatory Visit (HOSPITAL_BASED_OUTPATIENT_CLINIC_OR_DEPARTMENT_OTHER): Payer: BC Managed Care – PPO | Admitting: Nurse Practitioner

## 2015-01-02 ENCOUNTER — Telehealth: Payer: Self-pay | Admitting: Nurse Practitioner

## 2015-01-02 VITALS — BP 129/81 | HR 75 | Temp 97.8°F | Resp 19 | Ht 72.0 in | Wt 183.1 lb

## 2015-01-02 DIAGNOSIS — R195 Other fecal abnormalities: Secondary | ICD-10-CM

## 2015-01-02 DIAGNOSIS — E538 Deficiency of other specified B group vitamins: Secondary | ICD-10-CM

## 2015-01-02 DIAGNOSIS — W57XXXA Bitten or stung by nonvenomous insect and other nonvenomous arthropods, initial encounter: Secondary | ICD-10-CM | POA: Diagnosis not present

## 2015-01-02 DIAGNOSIS — D510 Vitamin B12 deficiency anemia due to intrinsic factor deficiency: Secondary | ICD-10-CM | POA: Diagnosis not present

## 2015-01-02 NOTE — Telephone Encounter (Signed)
Appointments made and avs printed for pateint °

## 2015-01-02 NOTE — Progress Notes (Signed)
ID: Kyle Gillespie   DOB: Mar 24, 1956  MR#: [id number]  WUJ#:[id number]  PCP: Junie Spencer, FNP GYN: SU:  OTHER MD: Judi Cong MD  INTERVAL HISTORY: [name] returns for followup of his B12 deficiency. The interval history is generally unremarkable for a kidney stone, which he finally passed 2 weeks ago. He follows up with urology next month.   REVIEW OF SYSTEMS: Kyle Gillespie denies fevers, chills, nausea, vomiting, or changes in bowel or bladder habits. His energy level is goo. He continues to work outside and performs lots of physical activity with his job. He denies shortness of breath, chest pain, cough, or palpitations. He has no headaches, dizziness, or vision changes. His mood is stable. He gives himself his B12 injections with no difficulty. A detailed review of systems is otherwise stable.  PAST MEDICAL HISTORY: Past Medical History  Diagnosis Date  . Vitamin B 12 deficiency   . DDD (degenerative disc disease)   . Macular degeneration     stage 1  . Dry eyes     PAST SURGICAL HISTORY: Past Surgical History  Procedure Laterality Date  . Appendectomy      FAMILY HISTORY The patient's father died after a stroke at the age of 16. The patient's mother is alive, she is 51 as of July of 2013. The patient has 2 brothers and one sister. Is not aware of any cancer or blood problems in the family.  SOCIAL HISTORY: [name] worked in a Field seismologist. His wife of 28 years, [name], is a Diplomatic Services operational officer at the [employer]. They have no children. He does have 3 dogs. The patient is not a church attender.    ADVANCED DIRECTIVES: in place  HEALTH MAINTENANCE: Social History  Substance Use Topics  . Smoking status: Never Smoker   . Smokeless tobacco: Never Used  . Alcohol Use: No     Colonoscopy: c. 2008  PSA: pending  Lipid panel: pending  Allergies  Allergen Reactions  . Penicillins Rash  . Morphine And Related Other (See Comments)    Severe  headache    Current Outpatient Prescriptions  Medication Sig Dispense Refill  . cyanocobalamin (,VITAMIN B-12,) 1000 MCG/ML injection Inject 1 mL (1,000 mcg total) into the muscle every 30 (thirty) days. 1 mL prn  . cycloSPORINE (RESTASIS) 0.05 % ophthalmic emulsion Place 1 drop into both eyes daily.    . Multiple Vitamins-Minerals (VISION VITAMINS PO) Take by mouth.    . Polyvinyl Alcohol-Povidone (REFRESH OP) Apply to eye.    Marland Kitchen ibuprofen (ADVIL,MOTRIN) 200 MG tablet Take 200 mg by mouth every 6 (six) hours as needed.    Marland Kitchen PAZEO 0.7 % SOLN     . promethazine (PHENERGAN) 25 MG tablet Take 1 tablet (25 mg total) by mouth every 6 (six) hours as needed. (Patient not taking: Reported on 01/02/2015) 10 tablet 0   No current facility-administered medications for this visit.    OBJECTIVE: Middle-aged white man who appears stated age 59 Vitals:   01/02/15 1350  BP: 129/81  Pulse: 75  Temp: 97.8 F (36.6 C)  Resp: 19     Body mass index is 24.83 kg/(m^2).    ECOG FS: 0  Skin: warm, dry  HEENT: sclerae anicteric, conjunctivae pink, oropharynx clear. No thrush or mucositis.  Lymph Nodes: No cervical or supraclavicular lymphadenopathy  Lungs: clear to auscultation bilaterally, no rales, wheezes, or rhonci  Heart: regular rate and rhythm  Abdomen: round, soft, non tender, positive bowel sounds  Musculoskeletal: No focal spinal tenderness, no peripheral edema  Neuro: non focal, well oriented, positive affect  LAB RESULTS: Lab Results  Component Value Date   WBC 4.0 12/14/2014   NEUTROABS 2.2 12/14/2014   HGB 15.8 12/14/2014   HCT 44.6 12/14/2014   MCV 86.6 12/14/2014   PLT 182 12/14/2014      Chemistry      Component Value Date/Time   NA 142 12/14/2014 1106   NA 141 11/23/2011 1126   K 4.2 12/14/2014 1106   K 4.4 11/23/2011 1126   CL 103 11/23/2011 1126   CO2 28 12/14/2014 1106   CO2 29 11/23/2011 1126   BUN 11.8 12/14/2014 1106   BUN 13 11/23/2011 1126   CREATININE 0.8  12/14/2014 1106   CREATININE 0.90 11/23/2011 1126      Component Value Date/Time   CALCIUM 9.6 12/14/2014 1106   CALCIUM 10.1 11/23/2011 1126   ALKPHOS 88 12/14/2014 1106   ALKPHOS 76 11/23/2011 1126   AST 33 12/14/2014 1106   AST 31 11/23/2011 1126   ALT 48 12/14/2014 1106   ALT 42 11/23/2011 1126   BILITOT 1.37* 12/14/2014 1106   BILITOT 0.7 11/23/2011 1126       No results found for: LABCA2  No components found for: LABCA125  No results for input(s): INR in the last 168 hours.  Urinalysis    Component Value Date/Time   COLORURINE YELLOW 12/18/2014 1230    STUDIES: Dg Abd 1 View  12/18/2014   CLINICAL DATA:  Kidney stone.  EXAM: ABDOMEN - 1 VIEW  COMPARISON:  CT abdomen 08/25/2006.  FINDINGS: A 5 mm calcification projects over the lower pole right renal outline. No definite radiopaque calculi project over the left renal outline or expected course of the ureters bilaterally. Phleboliths in the anatomic pelvis.  IMPRESSION: Right renal stone.   Electronically Signed   By: Leanna Battles M.D.   On: 12/18/2014 12:54   Ct Renal Stone Study  12/18/2014   CLINICAL DATA:  59 year old male with left flank pain since Friday and microscopic hematuria today. Initial encounter.  EXAM: CT ABDOMEN AND PELVIS WITHOUT CONTRAST  TECHNIQUE: Multidetector CT imaging of the abdomen and pelvis was performed following the standard protocol without IV contrast.  COMPARISON:  CT Abdomen and Pelvis 08/25/2006.  FINDINGS: Negative lung bases.  No pericardial or pleural effusion.  Chronic degenerative changes in the spine. No acute osseous abnormality identified.  Stable sequelae of vasectomy. No pelvic free fluid. Chronic pelvic phleboliths. Redundant sigmoid. Largely decompressed distal large bowel. Minimal distal colon diverticula. Retained stool proximal to the splenic flexure. Sequelae of appendectomy. Negative terminal ileum. No dilated small bowel. Decompressed stomach and duodenum.  Negative non  contrast liver, gallbladder, spleen, pancreas, and adrenal glands. No abdominal free fluid. No lymphadenopathy.  Several stones measuring up to 5 mm within the right kidney. No right hydronephrosis or hydroureter. Negative course of the right ureter. Unremarkable bladder. On the left there is perinephric stranding and mild hydronephrosis. There is no intra renal calculus on the left. Left periureteral stranding continues into the pelvis where a 3 mm calculus is located within 15 mm of the left ureterovesical junction. No stranding about the ureter distal to this stone.  IMPRESSION: 1. Acute obstructive uropathy on the left. 3 mm distal left ureteral calculus located about 15 mm proximal to the left UVJ. 2. Right nephrolithiasis.   Electronically Signed   By: Odessa Fleming M.D.   On: 12/18/2014 13:59   ASSESSMENT:  59 y.o. [name] with a history of pernicious anemia, on chronic B12 supplementation.  (1) multiple tick bites  (2) guaiac-positive stool  PLAN:  Conan is doing well today. The labs were reviewed in detail and were stable. His hgb is norma at 15.8 and his b12 is stable at 373. He will continue monthly B12 injections, and I have written for for enough vials and needles to last him through a year.   We discussed his repeat colonoscopy, but given the cost of this exam, he prefers to defer it to next year. He denies changes to his stool, including the sight of blood, or abdominal pain.   Deverick will return in 1 year for labs and a follow up visit. He understands and agrees with this plan. He knows the goal of treatment in his case is control. He has been encouraged to call with any issues that might arise before his next visit here.   Gwendolyn Fill Clive Parcel    01/02/2015

## 2015-01-10 ENCOUNTER — Other Ambulatory Visit (HOSPITAL_COMMUNITY): Payer: Self-pay | Admitting: Urology

## 2015-01-10 DIAGNOSIS — N200 Calculus of kidney: Secondary | ICD-10-CM

## 2015-04-11 ENCOUNTER — Ambulatory Visit (HOSPITAL_COMMUNITY)
Admission: RE | Admit: 2015-04-11 | Discharge: 2015-04-11 | Disposition: A | Discharge status: Home / Self Care | Payer: BC Managed Care – PPO | Source: Ambulatory Visit | Attending: Urology | Admitting: Urology

## 2015-04-11 DIAGNOSIS — N200 Calculus of kidney: Secondary | ICD-10-CM | POA: Diagnosis present

## 2015-04-17 ENCOUNTER — Other Ambulatory Visit: Payer: Self-pay | Admitting: Urology

## 2015-04-17 ENCOUNTER — Ambulatory Visit (INDEPENDENT_AMBULATORY_CARE_PROVIDER_SITE_OTHER): Payer: BC Managed Care – PPO | Admitting: Urology

## 2015-04-17 DIAGNOSIS — N200 Calculus of kidney: Secondary | ICD-10-CM

## 2015-04-24 ENCOUNTER — Ambulatory Visit: Payer: BC Managed Care – PPO

## 2015-10-02 ENCOUNTER — Ambulatory Visit (HOSPITAL_COMMUNITY)
Admission: RE | Admit: 2015-10-02 | Discharge: 2015-10-02 | Disposition: A | Discharge status: Home / Self Care | Payer: BC Managed Care – PPO | Source: Ambulatory Visit | Attending: Urology | Admitting: Urology

## 2015-10-02 ENCOUNTER — Other Ambulatory Visit: Payer: Self-pay | Admitting: Urology

## 2015-10-02 DIAGNOSIS — N200 Calculus of kidney: Secondary | ICD-10-CM

## 2015-10-16 ENCOUNTER — Ambulatory Visit (INDEPENDENT_AMBULATORY_CARE_PROVIDER_SITE_OTHER): Payer: BC Managed Care – PPO | Admitting: Urology

## 2015-10-16 DIAGNOSIS — N200 Calculus of kidney: Secondary | ICD-10-CM

## 2015-10-17 ENCOUNTER — Other Ambulatory Visit: Payer: Self-pay | Admitting: Urology

## 2015-11-11 ENCOUNTER — Encounter (HOSPITAL_COMMUNITY): Payer: Self-pay | Admitting: *Deleted

## 2015-11-14 ENCOUNTER — Ambulatory Visit (HOSPITAL_COMMUNITY): Payer: BC Managed Care – PPO

## 2015-11-14 ENCOUNTER — Encounter (HOSPITAL_COMMUNITY)
Admission: RE | Disposition: A | Discharge status: Home / Self Care | Payer: Self-pay | Source: Ambulatory Visit | Attending: Urology

## 2015-11-14 ENCOUNTER — Ambulatory Visit (HOSPITAL_COMMUNITY)
Admission: RE | Admit: 2015-11-14 | Discharge: 2015-11-14 | Disposition: A | Discharge status: Home / Self Care | Payer: BC Managed Care – PPO | Source: Ambulatory Visit | Attending: Urology | Admitting: Urology

## 2015-11-14 ENCOUNTER — Encounter (HOSPITAL_COMMUNITY): Payer: Self-pay

## 2015-11-14 DIAGNOSIS — Z87442 Personal history of urinary calculi: Secondary | ICD-10-CM | POA: Insufficient documentation

## 2015-11-14 DIAGNOSIS — N200 Calculus of kidney: Secondary | ICD-10-CM | POA: Insufficient documentation

## 2015-11-14 DIAGNOSIS — D510 Vitamin B12 deficiency anemia due to intrinsic factor deficiency: Secondary | ICD-10-CM | POA: Diagnosis not present

## 2015-11-14 DIAGNOSIS — Z79899 Other long term (current) drug therapy: Secondary | ICD-10-CM | POA: Insufficient documentation

## 2015-11-14 SURGERY — LITHOTRIPSY, ESWL
Anesthesia: LOCAL | Laterality: Right

## 2015-11-14 MED ORDER — DIPHENHYDRAMINE HCL 25 MG PO CAPS
25.0000 mg | ORAL_CAPSULE | ORAL | Status: AC
  Administered 2015-11-14: 25 mg via ORAL
  Filled 2015-11-14: qty 1

## 2015-11-14 MED ORDER — TAMSULOSIN HCL 0.4 MG PO CAPS: 0.4000 mg | ORAL_CAPSULE | Freq: Every day | ORAL | Status: DC

## 2015-11-14 MED ORDER — DIAZEPAM 5 MG PO TABS
10.0000 mg | ORAL_TABLET | ORAL | Status: AC
  Administered 2015-11-14: 10 mg via ORAL
  Filled 2015-11-14: qty 2

## 2015-11-14 MED ORDER — CIPROFLOXACIN HCL 500 MG PO TABS
500.0000 mg | ORAL_TABLET | ORAL | Status: AC
  Administered 2015-11-14: 500 mg via ORAL
  Filled 2015-11-14: qty 1

## 2015-11-14 MED ORDER — SODIUM CHLORIDE 0.9 % IV SOLN
INTRAVENOUS | Status: DC
  Administered 2015-11-14: 10:00:00 via INTRAVENOUS

## 2015-11-27 ENCOUNTER — Ambulatory Visit (HOSPITAL_COMMUNITY)
Admission: RE | Admit: 2015-11-27 | Discharge: 2015-11-27 | Disposition: A | Discharge status: Home / Self Care | Payer: BC Managed Care – PPO | Source: Ambulatory Visit | Attending: Urology | Admitting: Urology

## 2015-11-27 ENCOUNTER — Other Ambulatory Visit: Payer: Self-pay | Admitting: Urology

## 2015-11-27 DIAGNOSIS — N200 Calculus of kidney: Secondary | ICD-10-CM | POA: Diagnosis present

## 2015-11-27 DIAGNOSIS — Z09 Encounter for follow-up examination after completed treatment for conditions other than malignant neoplasm: Secondary | ICD-10-CM | POA: Diagnosis not present

## 2015-11-27 DIAGNOSIS — Z87442 Personal history of urinary calculi: Secondary | ICD-10-CM | POA: Diagnosis not present

## 2015-11-29 ENCOUNTER — Ambulatory Visit (INDEPENDENT_AMBULATORY_CARE_PROVIDER_SITE_OTHER): Payer: Self-pay | Admitting: Urology

## 2015-11-29 ENCOUNTER — Other Ambulatory Visit (HOSPITAL_COMMUNITY)
Admission: RE | Admit: 2015-11-29 | Discharge: 2015-11-29 | Disposition: A | Discharge status: Home / Self Care | Payer: BC Managed Care – PPO | Source: Ambulatory Visit | Attending: Oncology | Admitting: Oncology

## 2015-11-29 DIAGNOSIS — N200 Calculus of kidney: Secondary | ICD-10-CM | POA: Insufficient documentation

## 2015-11-29 DIAGNOSIS — Z9889 Other specified postprocedural states: Secondary | ICD-10-CM

## 2015-12-10 LAB — STONE ANALYSIS
CA PHOS CRY STONE QL IR: 3 %
Ca Oxalate,Monohydr.: 97 %
Stone Weight KSTONE: 27 mg

## 2015-12-16 ENCOUNTER — Telehealth: Payer: Self-pay | Admitting: Oncology

## 2015-12-16 NOTE — Telephone Encounter (Signed)
Pt called to change lab appt for 8/18 to 8/17.

## 2015-12-25 ENCOUNTER — Other Ambulatory Visit: Payer: Self-pay | Admitting: *Deleted

## 2015-12-25 DIAGNOSIS — E538 Deficiency of other specified B group vitamins: Secondary | ICD-10-CM

## 2015-12-26 ENCOUNTER — Other Ambulatory Visit (HOSPITAL_BASED_OUTPATIENT_CLINIC_OR_DEPARTMENT_OTHER): Payer: BC Managed Care – PPO

## 2015-12-26 DIAGNOSIS — D510 Vitamin B12 deficiency anemia due to intrinsic factor deficiency: Secondary | ICD-10-CM

## 2015-12-26 DIAGNOSIS — E538 Deficiency of other specified B group vitamins: Secondary | ICD-10-CM

## 2015-12-26 LAB — CBC WITH DIFFERENTIAL/PLATELET
BASO%: 0.6 % (ref 0.0–2.0)
Basophils Absolute: 0 10*3/uL (ref 0.0–0.1)
EOS ABS: 0.1 10*3/uL (ref 0.0–0.5)
EOS%: 3 % (ref 0.0–7.0)
HEMATOCRIT: 46.7 % (ref 38.4–49.9)
HEMOGLOBIN: 15.6 g/dL (ref 13.0–17.1)
LYMPH#: 1 10*3/uL (ref 0.9–3.3)
LYMPH%: 25.8 % (ref 14.0–49.0)
MCH: 29.6 pg (ref 27.2–33.4)
MCHC: 33.5 g/dL (ref 32.0–36.0)
MCV: 88.4 fL (ref 79.3–98.0)
MONO#: 0.4 10*3/uL (ref 0.1–0.9)
MONO%: 10.1 % (ref 0.0–14.0)
NEUT%: 60.5 % (ref 39.0–75.0)
NEUTROS ABS: 2.3 10*3/uL (ref 1.5–6.5)
PLATELETS: 210 10*3/uL (ref 140–400)
RBC: 5.29 10*6/uL (ref 4.20–5.82)
RDW: 13.2 % (ref 11.0–14.6)
WBC: 3.8 10*3/uL — AB (ref 4.0–10.3)

## 2015-12-26 LAB — COMPREHENSIVE METABOLIC PANEL
ALBUMIN: 4 g/dL (ref 3.5–5.0)
ALK PHOS: 76 U/L (ref 40–150)
ALT: 46 U/L (ref 0–55)
AST: 34 U/L (ref 5–34)
Anion Gap: 6 mEq/L (ref 3–11)
BUN: 10.7 mg/dL (ref 7.0–26.0)
CALCIUM: 9.4 mg/dL (ref 8.4–10.4)
CHLORIDE: 107 meq/L (ref 98–109)
CO2: 28 mEq/L (ref 22–29)
Creatinine: 0.8 mg/dL (ref 0.7–1.3)
Glucose: 87 mg/dl (ref 70–140)
POTASSIUM: 4.4 meq/L (ref 3.5–5.1)
SODIUM: 142 meq/L (ref 136–145)
Total Bilirubin: 0.74 mg/dL (ref 0.20–1.20)
Total Protein: 7.1 g/dL (ref 6.4–8.3)

## 2015-12-26 LAB — FERRITIN: Ferritin: 85 ng/ml (ref 22–316)

## 2015-12-27 ENCOUNTER — Other Ambulatory Visit: Payer: BC Managed Care – PPO

## 2016-01-05 NOTE — Progress Notes (Signed)
ID: Kyle Gillespie   DOB: 07/19/1955  MR#: [id number]  NWG#:[id number]#:[id number]  PCP: Jannifer Rodneyhristy Hawks, FNP GYN: SU:  OTHER MD: Judi CongJerald L Winter MD  INTERVAL HISTORY: Kyle Gillespie returns for followup of his B12 deficiency. He continues to give himself monthly shots without any complication.  REVIEW OF SYSTEMS: Kyle Gillespie had another episode of nephrolithiasis requiring lithotripsy in July. He did well with that. He has significant problems with dry eyes and is followed [facility]. [phone]). He is on Restasis and other eyedrops and that seems to be working well for him. He continues to have occasional back pain. Otherwise a detailed review of systems today was noncontributory  PAST MEDICAL HISTORY: Past Medical History:  Diagnosis Date  . DDD (degenerative disc disease)   . Dry eyes   . Macular degeneration    stage 1  . Vitamin B 12 deficiency     PAST SURGICAL HISTORY: Past Surgical History:  Procedure Laterality Date  . APPENDECTOMY      FAMILY HISTORY The patient's father died after a stroke at the age of 58. The patient's mother is alive, she is [id number] as of July of 2013. The patient has 2 brothers and one sister. Is not aware of any cancer or blood problems in the family.  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 His wife of 28+ years, Lynden AngVicky, is a Diplomatic Services operational officersecretary at the [employer]. They have no children. He has one remaining dog. The patient is not a church attender.    ADVANCED DIRECTIVES: in place  HEALTH MAINTENANCE: Social History  Substance Use Topics  . Smoking status: Never Smoker  . Smokeless tobacco: Never Used  . Alcohol use No     Colonoscopy: 2016  PSA: pending  Lipid panel: pending  Allergies  Allergen Reactions  . Penicillins Rash  . Morphine And Related Other (See Comments)    Severe headache  . Sulfa Antibiotics Other (See  Comments)    Patient states mother had reaction while she was pregnant and patient was instructed not to take.     Current Outpatient Prescriptions  Medication Sig Dispense Refill  . cyanocobalamin (,VITAMIN B-12,) 1000 MCG/ML injection Inject 1 mL (1,000 mcg total) into the muscle every 30 (thirty) days. 1 mL prn  . cycloSPORINE (RESTASIS) 0.05 % ophthalmic emulsion Place 1 drop into both eyes daily.    Marland Kitchen. ibuprofen (ADVIL,MOTRIN) 200 MG tablet Take 200 mg by mouth every 6 (six) hours as needed.    . Multiple Vitamins-Minerals (ICAPS MV PO) Take by mouth.    Marland Kitchen. PAZEO 0.7 % SOLN     . Polyvinyl Alcohol-Povidone (REFRESH OP) Apply to eye.    . tamsulosin (FLOMAX) 0.4 MG CAPS capsule Take 1 capsule (0.4 mg total) by mouth daily after supper. 30 capsule 0   No current facility-administered medications for this visit.     OBJECTIVE: Middle-aged white man who appears well Vitals:   01/06/16 1201  BP: 139/83  Pulse: 66  Resp: 18  Temp: 98.5 F (36.9 C)     Body mass index is 23.88 kg/m.    ECOG FS: 0  Sclerae unicteric, pupils round and equal Oropharynx clear and moist-- no thrush or other lesions No cervical or supraclavicular adenopathy Lungs no rales or rhonchi Heart regular rate and rhythm Abd soft, nontender, positive bowel sounds Rectal: Prostate flat, nonnodular, stool guaiac negative MSK no  focal spinal tenderness, no upper extremity lymphedema Neuro: nonfocal, well oriented, appropriate affect    LAB RESULTS: Lab Results  Component Value Date   WBC 3.8 (L) 12/26/2015   NEUTROABS 2.3 12/26/2015   HGB 15.6 12/26/2015   HCT 46.7 12/26/2015   MCV 88.4 12/26/2015   PLT 210 12/26/2015      Chemistry      Component Value Date/Time   NA 142 12/26/2015 1142   K 4.4 12/26/2015 1142   CL 103 11/23/2011 1126   CO2 28 12/26/2015 1142   BUN 10.7 12/26/2015 1142   CREATININE 0.8 12/26/2015 1142      Component Value Date/Time   CALCIUM 9.4 12/26/2015 1142   ALKPHOS 76  12/26/2015 1142   AST 34 12/26/2015 1142   ALT 46 12/26/2015 1142   BILITOT 0.74 12/26/2015 1142       No results found for: LABCA2  No components found for: LABCA125  No results for input(s): INR in the last 168 hours.  Urinalysis    Component Value Date/Time   COLORURINE YELLOW 12/18/2014 1230    STUDIES: No results found. ASSESSMENT: 60 y.o. [name] with a history of pernicious anemia, on chronic B12 supplementation.  (1) multiple tick bites  (2) guaiac-positive stool--benign colonoscopy 04/18/2015  (3)  Nephrolithiasis  PLAN:  Kyle Gillespie is very stable as far as his B12 is concerned. He is well established with urology regarding his sister at nephrolithiasis and is following the appropriate diet. He finally had his colonoscopy and there was no evidence of colon cancer.  He is going to see me again in one year. Before that visit in addition to routine labs will have a repeat lipid panel and PSA.  He knows to call for any problems that may develop before his next visit here.  Aiana Nordquist C    01/06/2016

## 2016-01-06 ENCOUNTER — Telehealth: Payer: Self-pay | Admitting: Oncology

## 2016-01-06 ENCOUNTER — Ambulatory Visit (HOSPITAL_BASED_OUTPATIENT_CLINIC_OR_DEPARTMENT_OTHER): Payer: BC Managed Care – PPO | Admitting: Oncology

## 2016-01-06 VITALS — BP 139/83 | HR 66 | Temp 98.5°F | Resp 18 | Ht 74.0 in | Wt 186.0 lb

## 2016-01-06 DIAGNOSIS — M5137 Other intervertebral disc degeneration, lumbosacral region: Secondary | ICD-10-CM

## 2016-01-06 DIAGNOSIS — E538 Deficiency of other specified B group vitamins: Secondary | ICD-10-CM

## 2016-01-06 NOTE — Telephone Encounter (Signed)
appt made and avs printed °

## 2016-03-04 ENCOUNTER — Other Ambulatory Visit: Payer: Self-pay | Admitting: *Deleted

## 2016-03-04 MED ORDER — CYANOCOBALAMIN 1000 MCG/ML IJ SOLN: 1000.0000 ug | INTRAMUSCULAR | 12 refills | Status: DC

## 2016-04-09 ENCOUNTER — Other Ambulatory Visit: Payer: Self-pay | Admitting: Urology

## 2016-04-09 DIAGNOSIS — N200 Calculus of kidney: Secondary | ICD-10-CM

## 2016-04-16 ENCOUNTER — Ambulatory Visit (HOSPITAL_COMMUNITY)
Admission: RE | Admit: 2016-04-16 | Discharge: 2016-04-16 | Disposition: A | Discharge status: Home / Self Care | Payer: BC Managed Care – PPO | Source: Ambulatory Visit | Attending: Urology | Admitting: Urology

## 2016-04-16 DIAGNOSIS — Z87442 Personal history of urinary calculi: Secondary | ICD-10-CM | POA: Diagnosis not present

## 2016-04-16 DIAGNOSIS — N200 Calculus of kidney: Secondary | ICD-10-CM | POA: Diagnosis present

## 2016-05-08 ENCOUNTER — Ambulatory Visit: Payer: BC Managed Care – PPO | Admitting: Urology

## 2017-01-06 ENCOUNTER — Ambulatory Visit (HOSPITAL_BASED_OUTPATIENT_CLINIC_OR_DEPARTMENT_OTHER): Payer: BC Managed Care – PPO | Admitting: Oncology

## 2017-01-06 ENCOUNTER — Other Ambulatory Visit (HOSPITAL_BASED_OUTPATIENT_CLINIC_OR_DEPARTMENT_OTHER): Payer: BC Managed Care – PPO

## 2017-01-06 VITALS — BP 121/75 | HR 68 | Temp 98.2°F | Resp 18 | Ht 74.0 in | Wt 193.1 lb

## 2017-01-06 DIAGNOSIS — E538 Deficiency of other specified B group vitamins: Secondary | ICD-10-CM

## 2017-01-06 DIAGNOSIS — D510 Vitamin B12 deficiency anemia due to intrinsic factor deficiency: Secondary | ICD-10-CM

## 2017-01-06 DIAGNOSIS — M5137 Other intervertebral disc degeneration, lumbosacral region: Secondary | ICD-10-CM

## 2017-01-06 LAB — COMPREHENSIVE METABOLIC PANEL
ALT: 47 U/L (ref 0–55)
ANION GAP: 5 meq/L (ref 3–11)
AST: 35 U/L — ABNORMAL HIGH (ref 5–34)
Albumin: 4 g/dL (ref 3.5–5.0)
Alkaline Phosphatase: 73 U/L (ref 40–150)
BUN: 13.8 mg/dL (ref 7.0–26.0)
CALCIUM: 9.7 mg/dL (ref 8.4–10.4)
CHLORIDE: 106 meq/L (ref 98–109)
CO2: 28 meq/L (ref 22–29)
CREATININE: 0.9 mg/dL (ref 0.7–1.3)
EGFR: >90 mL/min/{1.73_m2} (ref >90)
Glucose: 90 mg/dl (ref 70–140)
POTASSIUM: 4.5 meq/L (ref 3.5–5.1)
Sodium: 140 mEq/L (ref 136–145)
Total Bilirubin: 1.33 mg/dL — ABNORMAL HIGH (ref 0.20–1.20)
Total Protein: 7 g/dL (ref 6.4–8.3)

## 2017-01-06 LAB — CBC WITH DIFFERENTIAL/PLATELET
BASO%: 0.5 % (ref 0.0–2.0)
BASOS ABS: 0 10*3/uL (ref 0.0–0.1)
EOS%: 3.1 % (ref 0.0–7.0)
Eosinophils Absolute: 0.1 10*3/uL (ref 0.0–0.5)
HEMATOCRIT: 46.8 % (ref 38.4–49.9)
HGB: 15.8 g/dL (ref 13.0–17.1)
LYMPH%: 30.9 % (ref 14.0–49.0)
MCH: 30 pg (ref 27.2–33.4)
MCHC: 33.8 g/dL (ref 32.0–36.0)
MCV: 88.7 fL (ref 79.3–98.0)
MONO#: 0.4 10*3/uL (ref 0.1–0.9)
MONO%: 11.1 % (ref 0.0–14.0)
NEUT#: 2 10*3/uL (ref 1.5–6.5)
NEUT%: 54.4 % (ref 39.0–75.0)
PLATELETS: 184 10*3/uL (ref 140–400)
RBC: 5.28 10*6/uL (ref 4.20–5.82)
RDW: 13.7 % (ref 11.0–14.6)
WBC: 3.7 10*3/uL — ABNORMAL LOW (ref 4.0–10.3)
lymph#: 1.2 10*3/uL (ref 0.9–3.3)

## 2017-01-06 NOTE — Progress Notes (Signed)
ID: Kyle Gillespie   DOB: [birth year] 61  MR#: [id number]  GEX#:[id number]  PCP: Kyle Spencer, FNP GYN: SU:  OTHER MD: Kyle Cong MD  INTERVAL HISTORY: Kyle Gillespie returns today for follow-up and treatment of his B12 deficiency. He continues to treat himself on a monthly basis and has had no problems obtaining the B12 or administering it.  He continues to work at [employer], part-time. 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 His wife just retired earlier this month. She is doing crafts and cleaning the house.  REVIEW OF SYSTEMS: Kyle Gillespie continues to have low back problems. This is chronic, and is not worse than before, but it does limit him somewhat. He has mild sinus symptoms. He has not had any further problems with kidney stones. A detailed review of systems today was otherwise stable.  PAST MEDICAL HISTORY: Past Medical History:  Diagnosis Date  . DDD (degenerative disc disease)   . Dry eyes   . Macular degeneration    stage 1  . Vitamin B 12 deficiency     PAST SURGICAL HISTORY: Past Surgical History:  Procedure Laterality Date  . APPENDECTOMY      FAMILY HISTORY The patient's father died after a stroke at the age of 34. The patient's mother is alive, she is 93 as of July of 2013. The patient has 2 brothers and one sister. Is not aware of any cancer or blood problems in the family.  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 His wife of 28+ years, Kyle Gillespie, is a Diplomatic Services operational officer at the [employer]. She retired August 2018. They have no children.  The patient is not a church attender.    ADVANCED DIRECTIVES: in place  HEALTH MAINTENANCE: Social History  Substance Use Topics  . Smoking status: Never Smoker  . Smokeless tobacco: Never Used  . Alcohol use No     Colonoscopy: 2016  PSA: Stable  Lipid panel: pending  Allergies  Allergen Reactions  . Penicillins Rash  .  Morphine And Related Other (See Comments)    Severe headache  . Sulfa Antibiotics Other (See Comments)    Patient states mother had reaction while she was pregnant and patient was instructed not to take.     Current Outpatient Prescriptions  Medication Sig Dispense Refill  . cyanocobalamin (,VITAMIN B-12,) 1000 MCG/ML injection Inject 1 mL (1,000 mcg total) into the muscle every 30 (thirty) days. 1 mL 12  . cycloSPORINE (RESTASIS) 0.05 % ophthalmic emulsion Place 1 drop into both eyes daily.    Marland Kitchen ibuprofen (ADVIL,MOTRIN) 200 MG tablet Take 200 mg by mouth every 6 (six) hours as needed.    . Multiple Vitamins-Minerals (ICAPS MV PO) Take by mouth.    Marland Kitchen PAZEO 0.7 % SOLN     . Polyvinyl Alcohol-Povidone (REFRESH OP) Apply to eye.    . tamsulosin (FLOMAX) 0.4 MG CAPS capsule Take 1 capsule (0.4 mg total) by mouth daily after supper. 30 capsule 0   No current facility-administered medications for this visit.     OBJECTIVE: Middle-aged white man In no acute distress  Vitals:   01/06/17 1134  BP: 121/75  Pulse: 68  Resp: 18  Temp: 98.2 F (36.8 C)  SpO2: 100%     Body mass index is 24.79 kg/m.    ECOG FS: 0  Sclerae unicteric, EOMs intact Oropharynx clear and moist No cervical or supraclavicular adenopathy Lungs no  rales or rhonchi Heart regular rate and rhythm Abd soft, nontender, positive bowel sounds Rectal: Guaiac-negative; prostate not enlarged or nodular MSK no focal spinal tenderness, no upper extremity lymphedema Neuro: nonfocal, well oriented, appropriate affect Skin: No suspicious lesions noted  LAB RESULTS: Lab Results  Component Value Date   WBC 3.7 (L) 01/06/2017   NEUTROABS 2.0 01/06/2017   HGB 15.8 01/06/2017   HCT 46.8 01/06/2017   MCV 88.7 01/06/2017   PLT 184 01/06/2017      Chemistry      Component Value Date/Time   NA 142 12/26/2015 1142   K 4.4 12/26/2015 1142   CL 103 11/23/2011 1126   CO2 28 12/26/2015 1142   BUN 10.7 12/26/2015 1142    CREATININE 0.8 12/26/2015 1142      Component Value Date/Time   CALCIUM 9.4 12/26/2015 1142   ALKPHOS 76 12/26/2015 1142   AST 34 12/26/2015 1142   ALT 46 12/26/2015 1142   BILITOT 0.74 12/26/2015 1142       No results found for: LABCA2  No components found for: LABCA125  No results for input(s): INR in the last 168 hours.  Urinalysis    Component Value Date/Time   COLORURINE YELLOW 12/18/2014 1230    STUDIES: No results found.   ASSESSMENT: 61 y.o. Kyle Gillespie man with a history of pernicious anemia, on chronic B12 supplementation.  (59) multiple tick bites  (2) guaiac-positive stool--benign colonoscopy 04/18/2015  (3)  Nephrolithiasis  PLAN:  Kyle Gillespie to do well as far as his B-12 deficiency is concerned. We are actually serving as his primary care physician right now so we have a PSA and lipid panel pending. Rectal exam was unremarkable and the prostate was not enlarged  On skin exam today I don't see any worrisome features. I warned him regarding sun exposure particularly on his arms since he spent so much time outside  He will see me again in one year. He knows to call for any problems that may develop before his next visit here. Kyle Gillespie C    01/06/2017

## 2017-01-07 LAB — LIPID PANEL
CHOLESTEROL TOTAL: 176 mg/dL (ref 100–199)
Chol/HDL Ratio: 3.5 ratio (ref 0.0–5.0)
HDL: 50 mg/dL (ref >39)
LDL Calculated: 108 mg/dL — ABNORMAL HIGH (ref 0–99)
TRIGLYCERIDES: 88 mg/dL (ref 0–149)
VLDL Cholesterol Cal: 18 mg/dL (ref 5–40)

## 2017-01-07 LAB — PSA: PROSTATE SPECIFIC AG, SERUM: 2 ng/mL (ref 0.0–4.0)

## 2017-01-08 ENCOUNTER — Other Ambulatory Visit: Payer: Self-pay | Admitting: Oncology

## 2017-01-08 ENCOUNTER — Encounter: Payer: Self-pay | Admitting: Oncology

## 2017-01-21 ENCOUNTER — Other Ambulatory Visit: Payer: Self-pay | Admitting: Oncology

## 2017-02-28 ENCOUNTER — Other Ambulatory Visit: Payer: Self-pay | Admitting: Oncology

## 2017-05-10 DIAGNOSIS — M722 Plantar fascial fibromatosis: Secondary | ICD-10-CM | POA: Insufficient documentation

## 2017-07-08 IMAGING — CR DG ABDOMEN 1V
1 series · 1 of 1 positions shown · non-contrast
Comparison: CT abdomen 08/25/2006.

CLINICAL DATA: Kidney stone.

EXAM:
ABDOMEN - 1 VIEW

[view not recorded]
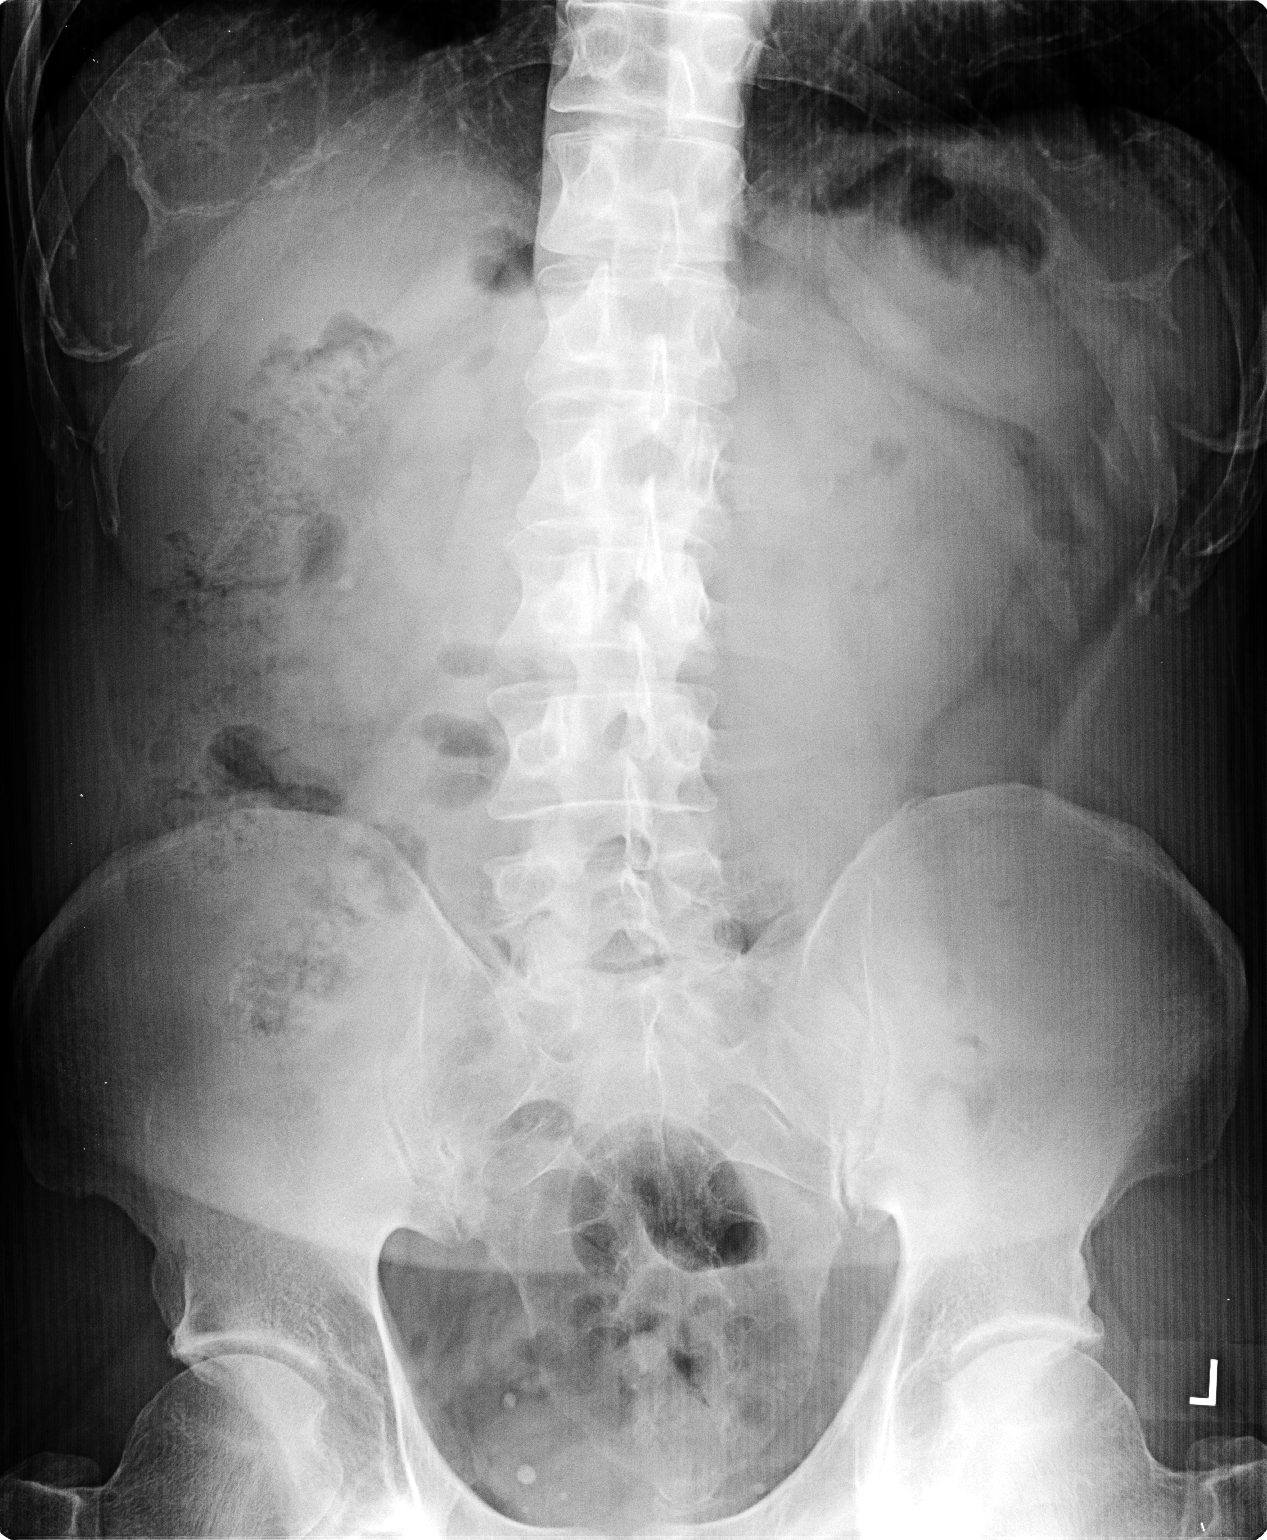

[1 of 1 positions shown; findings below may reference images not displayed]

FINDINGS: A 5 mm calcification projects over the lower pole right renal
outline. No definite radiopaque calculi project over the left renal
outline or expected course of the ureters bilaterally. Phleboliths
in the anatomic pelvis.
IMPRESSION: Right renal stone.

## 2017-07-08 IMAGING — CT CT RENAL STONE PROTOCOL
2 of 4 series · 16 of 46 positions shown, 18 images · non-contrast
Comparison: CT Abdomen and Pelvis 08/25/2006.

CLINICAL DATA: 59-year-old male with left flank pain since [REDACTED]
and microscopic hematuria today. Initial encounter.

EXAM:
CT ABDOMEN AND PELVIS WITHOUT CONTRAST
TECHNIQUE: Multidetector CT imaging of the abdomen and pelvis was performed
following the standard protocol without IV contrast.

[Series 2: standard/full over (age)lbs 5.0 · axial · 0.72mm/px · z∈[-562,-92]mm · 13 of 104 slices shown, 15 images]
[im 5/104  soft-tissue]
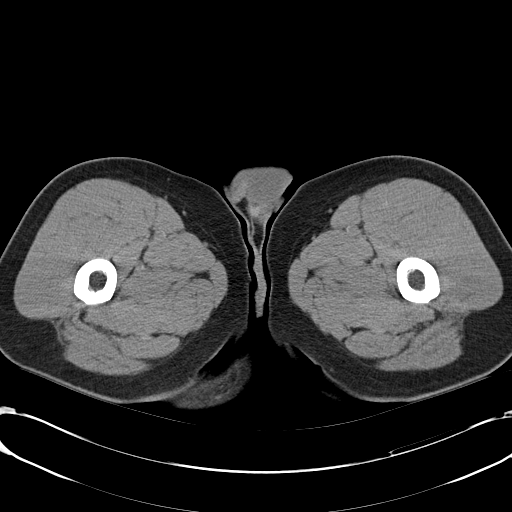
[im 5/104  bone]
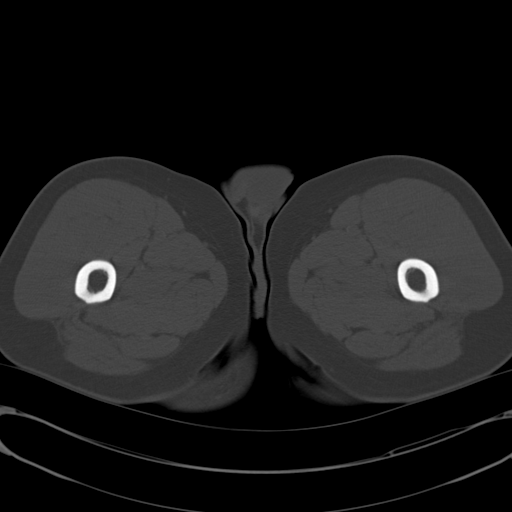
[im 13/104  soft-tissue]
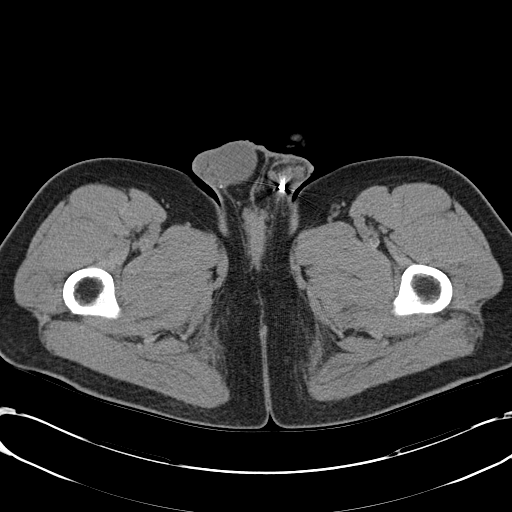
[im 21/104  soft-tissue]
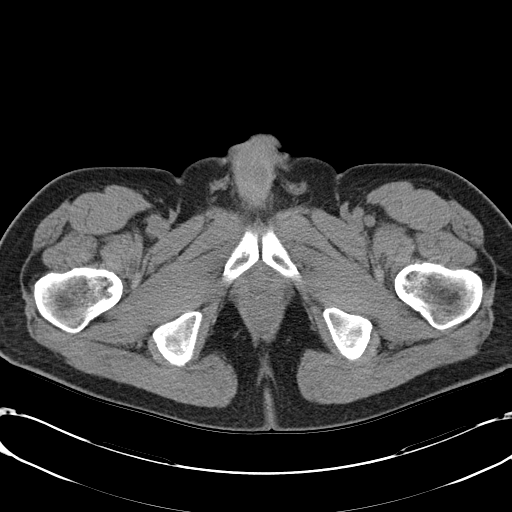
[im 29/104  soft-tissue]
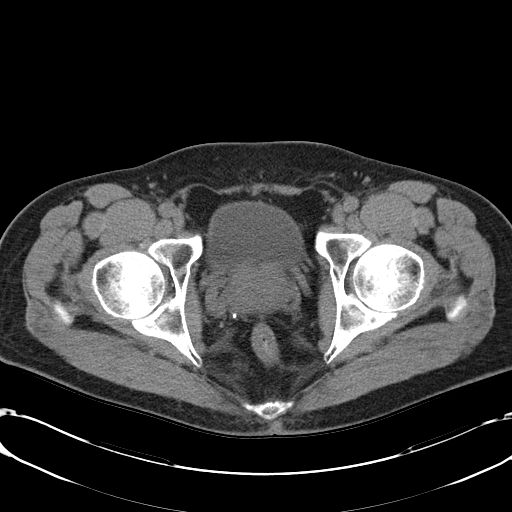
[im 38/104  soft-tissue]
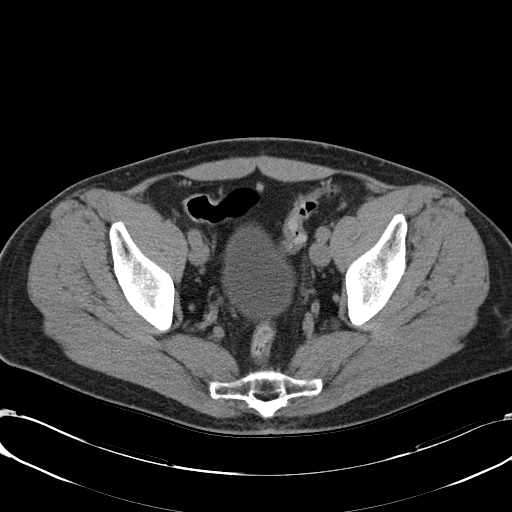
[im 46/104  soft-tissue]
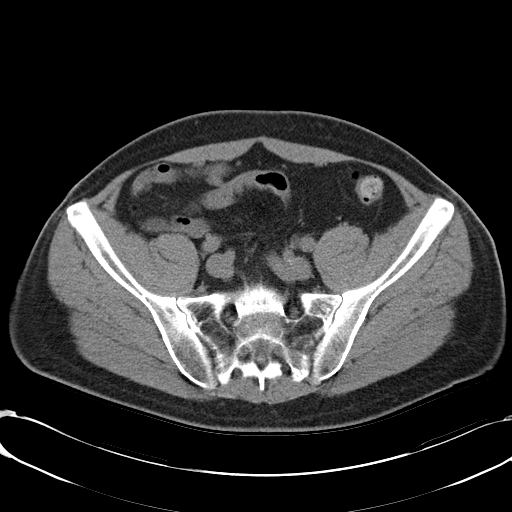
[im 54/104  soft-tissue]
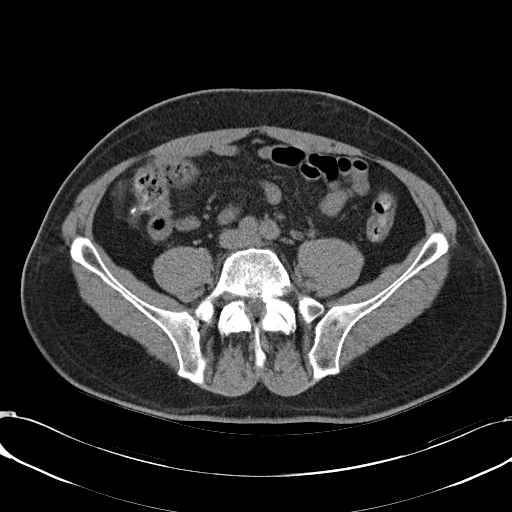
[im 58/104  soft-tissue]
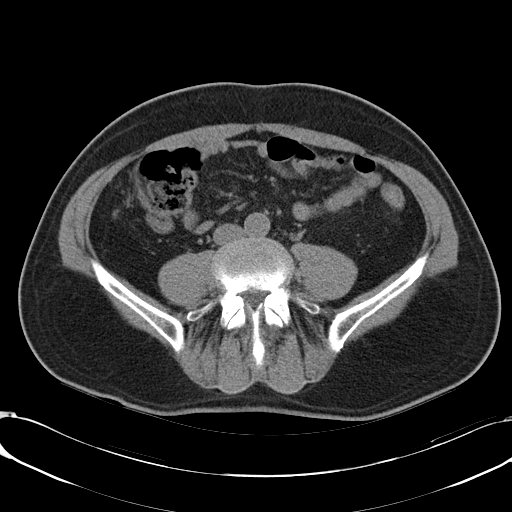
[im 66/104  soft-tissue]
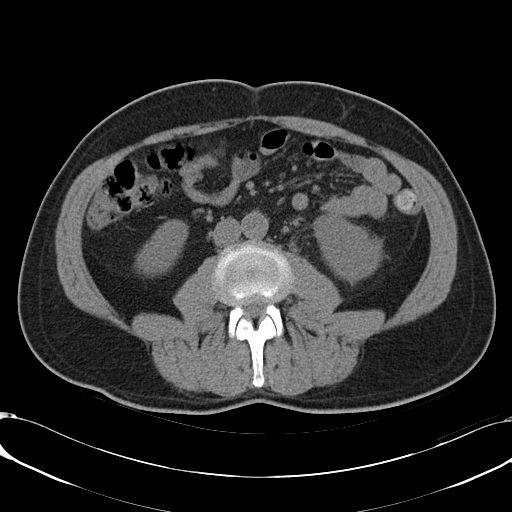
[im 66/104  bone]
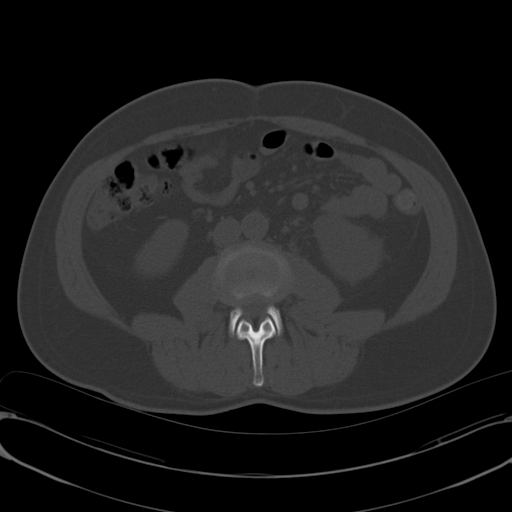
[im 75/104  soft-tissue]
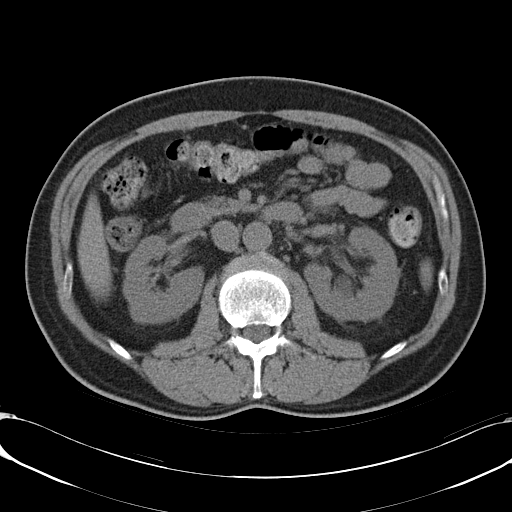
[im 83/104  soft-tissue]
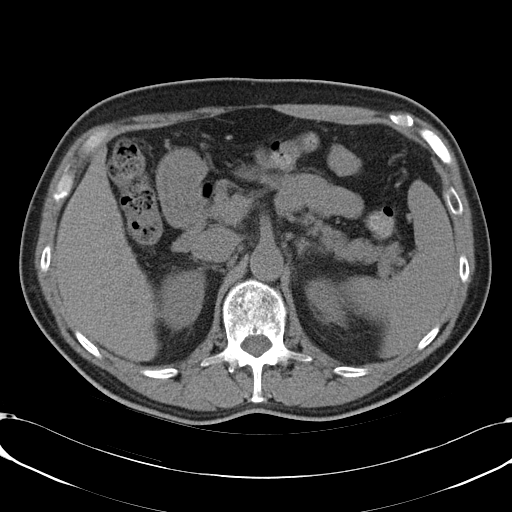
[im 91/104  soft-tissue]
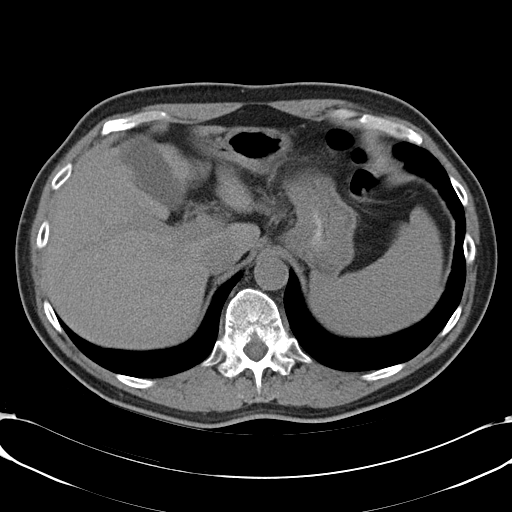
[im 99/104  soft-tissue]
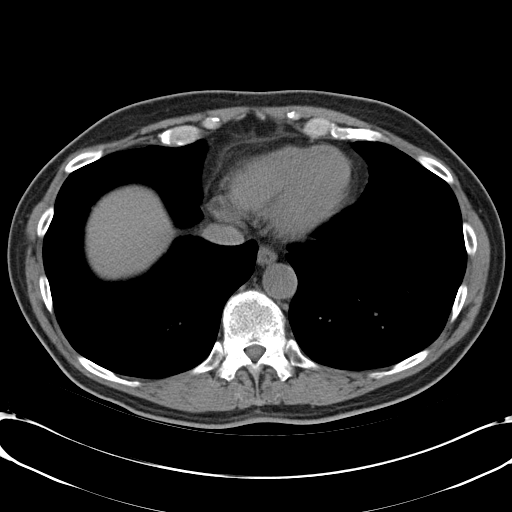

[Series 4: mpr coronal · coronal · 0.72mm/px · 3 of 88 slices shown]
[im 30/88  soft-tissue]
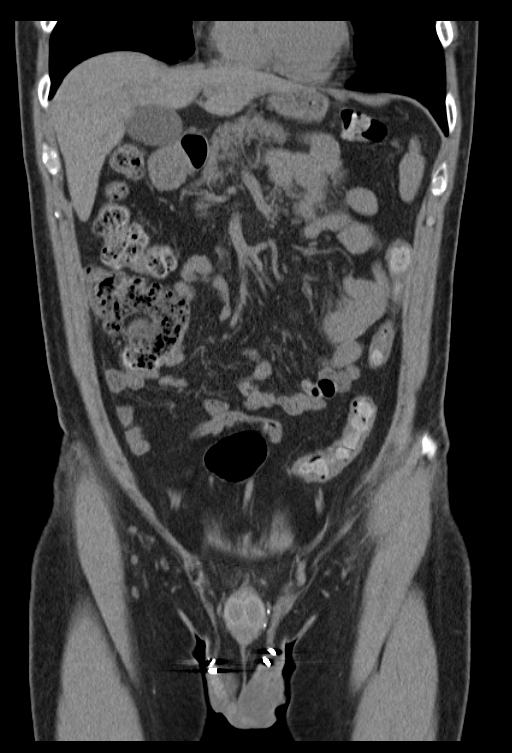
[im 39/88  soft-tissue]
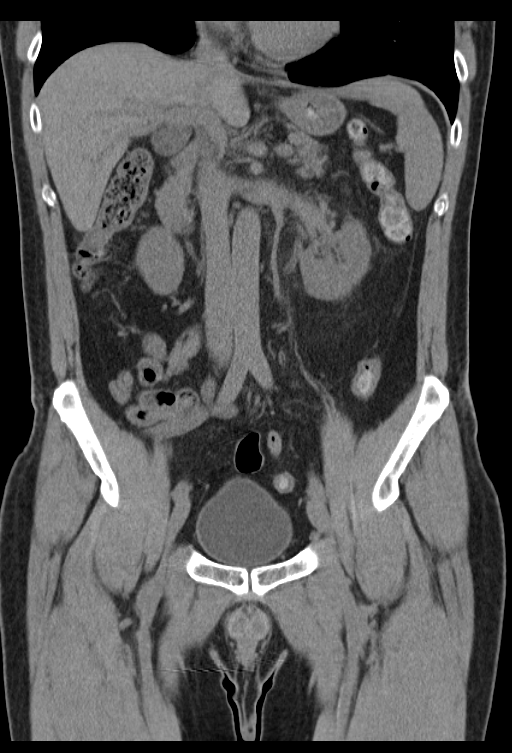
[im 49/88  soft-tissue]
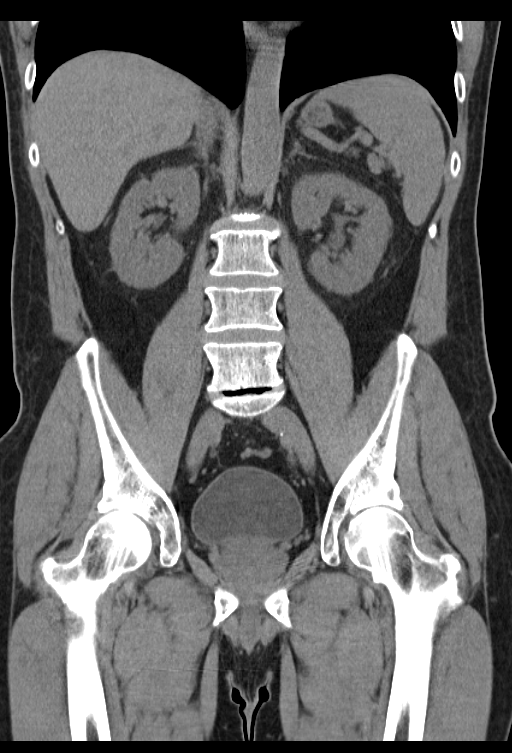

[16 of 46 positions shown; findings below may reference images not displayed]

FINDINGS: Negative lung bases.  No pericardial or pleural effusion.

Chronic degenerative changes in the spine. No acute osseous
abnormality identified.

Stable sequelae of vasectomy. No pelvic free fluid. Chronic pelvic
phleboliths. Redundant sigmoid. Largely decompressed distal large
bowel. Minimal distal colon diverticula. Retained stool proximal to
the splenic flexure. Sequelae of appendectomy. Negative terminal
ileum. No dilated small bowel. Decompressed stomach and duodenum.

Negative non contrast liver, gallbladder, spleen, pancreas, and
adrenal glands. No abdominal free fluid. No lymphadenopathy.

Several stones measuring up to 5 mm within the right kidney. No
right hydronephrosis or hydroureter. Negative course of the right
ureter. Unremarkable bladder. On the left there is perinephric
stranding and mild hydronephrosis. There is no intra renal calculus
on the left. Left periureteral stranding continues into the pelvis
where a 3 mm calculus is located within 15 mm of the left
ureterovesical junction. No stranding about the ureter distal to
this stone.
IMPRESSION: 1. Acute obstructive uropathy on the left. 3 mm distal left ureteral
calculus located about 15 mm proximal to the left UVJ.
2. Right nephrolithiasis.

## 2018-01-05 NOTE — Progress Notes (Signed)
ID: Kyle Gillespie   DOB: 07/15/1955  MR#: [id number]  WUJ#:[id number]  PCP: Junie Spencer, FNP SU:  OTHER MD: Judi Cong MD  INTERVAL HISTORY: Kyle Gillespie returns today for follow-up of his B12 deficiency. He gives himself the B-12 s shots monthly. He currently feels stable. He denies feeling fatigued.   REVIEW OF SYSTEMS: Kyle Gillespie is still working part-time at [employer]. His wife also retired. He notes that his dog has cancer. He notes that 6 out of 8 dogs that he's had have died from cancer. He says theres a coal plant about 6 mines from his home and the grass may be contaminated from the chemicals. He denies unusual headaches, visual changes, nausea, vomiting, or dizziness. There has been no unusual cough, phlegm production, or pleurisy. There has been no change in bowel or bladder habits. He denies or unexplained weight loss, bleeding, rash, or fever. A detailed review of systems was otherwise stable.    PAST MEDICAL HISTORY: Past Medical History:  Diagnosis Date  . DDD (degenerative disc disease)   . Dry eyes   . Macular degeneration    stage 1  . Vitamin B 12 deficiency     PAST SURGICAL HISTORY: Past Surgical History:  Procedure Laterality Date  . APPENDECTOMY      FAMILY HISTORY The patient's father died after a stroke at the age of 16. The patient's mother is alive, she is 50 as of July of 2013. The patient has 2 brothers and one sister. Is not aware of any cancer or blood problems in the family.  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 His wife of 28+ years, Kyle Gillespie, is a Diplomatic Services operational officer at the [employer]. She retired August 2018. They have no children.  The patient is not a church attender.    ADVANCED DIRECTIVES: in place  HEALTH MAINTENANCE: Social History   Tobacco Use  . Smoking status: Never Smoker  . Smokeless tobacco: Never Used  Substance Use Topics   . Alcohol use: No  . Drug use: No     Colonoscopy: December 2016/ Dr. [name] normal  PSA: Stable  Lipid panel: pending  Allergies  Allergen Reactions  . Penicillins Rash  . Morphine And Related Other (See Comments)    Severe headache  . Sulfa Antibiotics Other (See Comments)    Patient states mother had reaction while she was pregnant and patient was instructed not to take.     Current Outpatient Medications  Medication Sig Dispense Refill  . cyanocobalamin (,VITAMIN B-12,) 1000 MCG/ML injection INJECT 1 ML INTRAMUSCULARLY EVERY 30 DAYS 12 mL 0  . ibuprofen (ADVIL,MOTRIN) 200 MG tablet Take 200 mg by mouth every 6 (six) hours as needed.    . Multiple Vitamins-Minerals (ICAPS MV PO) Take by mouth.    Marland Kitchen PAZEO 0.7 % SOLN      No current facility-administered medications for this visit.     OBJECTIVE: Middle-aged white man appears well  Vitals:   01/06/18 1205  BP: 119/84  Pulse: 68  Resp: 16  Temp: (!) 97.5 F (36.4 C)  SpO2: 99%     Body mass index is 24.52 kg/m.    ECOG FS: 0  Sclerae unicteric, pupils round and equal Oropharynx clear and moist No cervical or supraclavicular adenopathy Lungs no rales or rhonchi Heart regular rate and rhythm Abd soft, nontender, positive bowel sounds Rectal exam finds no prostate masses.  No stool in vault MSK no focal spinal tenderness Neuro: nonfocal, well oriented, appropriate affect Skin: No suspicious lesions noted   LAB RESULTS: Lab Results  Component Value Date   WBC 3.2 (L) 01/06/2018   NEUTROABS 1.6 01/06/2018   HGB 15.8 01/06/2018   HCT 45.2 01/06/2018   MCV 87.1 01/06/2018   PLT 151 01/06/2018      Chemistry      Component Value Date/Time   NA 140 01/06/2017 1123   K 4.5 01/06/2017 1123   CL 103 11/23/2011 1126   CO2 28 01/06/2017 1123   BUN 13.8 01/06/2017 1123   CREATININE 0.9 01/06/2017 1123      Component Value Date/Time   CALCIUM 9.7 01/06/2017 1123   ALKPHOS 73 01/06/2017 1123   AST 35 (H)  01/06/2017 1123   ALT 47 01/06/2017 1123   BILITOT 1.33 (H) 01/06/2017 1123       No results found for: LABCA2  No components found for: [id number]  No results for input(s): INR in the last 168 hours.  Urinalysis    Component Value Date/Time   COLORURINE YELLOW 12/18/2014 1230    STUDIES: No results found.   ASSESSMENT: 62 y.o. [name] with a history of pernicious anemia, on chronic B12 supplementation.  (1) multiple tick bites  (2) guaiac-positive stool-- colonoscopy 04/18/2015 negative  (3)  Nephrolithiasis  PLAN:  Kyle Gillespie is doing fine as far as his B12 deficiency is concerned and is very normal as far as his health maintenance is concerned.  We reviewed his lab work and as soon as his see med and lipid panel are available as well as his PSA I will send him a letter with those results.  He will see me again in a year.  We will repeat lab work and physical exam at that time  He tells me as soon as I plan to retire he will establish himself with [facility].  Of course he already sees them for routine viral and other illnesses  He knows to call for any other issues that may develop before the next visit.  [name], [name], MD  01/06/18 12:19 PM Medical Oncology and Hematology [facility] [address] [place], [place] [address] Tel. [phone]    Fax. [phone]  [name], [name], am acting as scribe for [name].  I, [name], have reviewed the above documentation for accuracy and completeness, and I agree with the above.

## 2018-01-06 ENCOUNTER — Inpatient Hospital Stay: Payer: BLUE CROSS/BLUE SHIELD | Attending: Oncology | Admitting: Oncology

## 2018-01-06 ENCOUNTER — Inpatient Hospital Stay: Payer: BLUE CROSS/BLUE SHIELD

## 2018-01-06 ENCOUNTER — Telehealth: Payer: Self-pay | Admitting: Oncology

## 2018-01-06 VITALS — BP 119/84 | HR 68 | Temp 97.5°F | Resp 16 | Ht 74.0 in | Wt 191.0 lb

## 2018-01-06 DIAGNOSIS — E538 Deficiency of other specified B group vitamins: Secondary | ICD-10-CM

## 2018-01-06 DIAGNOSIS — M5137 Other intervertebral disc degeneration, lumbosacral region: Secondary | ICD-10-CM

## 2018-01-06 DIAGNOSIS — N200 Calculus of kidney: Secondary | ICD-10-CM | POA: Diagnosis not present

## 2018-01-06 DIAGNOSIS — D510 Vitamin B12 deficiency anemia due to intrinsic factor deficiency: Secondary | ICD-10-CM | POA: Insufficient documentation

## 2018-01-06 LAB — CBC WITH DIFFERENTIAL/PLATELET
BASOS PCT: 0 %
Basophils Absolute: 0 10*3/uL (ref 0.0–0.1)
Eosinophils Absolute: 0.1 10*3/uL (ref 0.0–0.5)
Eosinophils Relative: 4 %
HEMATOCRIT: 45.2 % (ref 38.4–49.9)
Hemoglobin: 15.8 g/dL (ref 13.0–17.1)
LYMPHS ABS: 1.1 10*3/uL (ref 0.9–3.3)
LYMPHS PCT: 34 %
MCH: 30.4 pg (ref 27.2–33.4)
MCHC: 35 g/dL (ref 32.0–36.0)
MCV: 87.1 fL (ref 79.3–98.0)
MONO ABS: 0.4 10*3/uL (ref 0.1–0.9)
MONOS PCT: 12 %
NEUTROS ABS: 1.6 10*3/uL (ref 1.5–6.5)
NEUTROS PCT: 50 %
Platelets: 151 10*3/uL (ref 140–400)
RBC: 5.19 MIL/uL (ref 4.20–5.82)
RDW: 13.1 % (ref 11.0–14.6)
WBC: 3.2 10*3/uL — ABNORMAL LOW (ref 4.0–10.3)

## 2018-01-06 LAB — COMPREHENSIVE METABOLIC PANEL
ALT: 43 U/L (ref 0–44)
ANION GAP: 8 (ref 5–15)
AST: 33 U/L (ref 15–41)
Albumin: 4.2 g/dL (ref 3.5–5.0)
Alkaline Phosphatase: 71 U/L (ref 38–126)
BUN: 11 mg/dL (ref 6–20)
CALCIUM: 9.5 mg/dL (ref 8.9–10.3)
CO2: 29 mmol/L (ref 22–32)
Chloride: 106 mmol/L (ref 98–111)
Creatinine, Ser: 0.88 mg/dL (ref 0.61–1.24)
GFR calc non Af Amer: >60 mL/min (ref >60)
Glucose, Bld: 87 mg/dL (ref 70–99)
POTASSIUM: 4.4 mmol/L (ref 3.5–5.1)
Sodium: 143 mmol/L (ref 135–145)
Total Bilirubin: 1 mg/dL (ref 0.3–1.2)
Total Protein: 7 g/dL (ref 6.5–8.1)

## 2018-01-06 LAB — LIPID PANEL
CHOL/HDL RATIO: 3.8 ratio
Cholesterol: 187 mg/dL (ref 0–200)
HDL: 49 mg/dL (ref >40)
LDL CALC: 131 mg/dL — AB (ref 0–99)
Triglycerides: 34 mg/dL (ref <150)
VLDL: 7 mg/dL (ref 0–40)

## 2018-01-06 MED ORDER — CYANOCOBALAMIN 1000 MCG/ML IJ SOLN: INTRAMUSCULAR | 0 refills | Status: DC

## 2018-01-06 NOTE — Telephone Encounter (Signed)
Gave patient avs and calendar.   °

## 2018-01-07 LAB — PROSTATE-SPECIFIC AG, SERUM (LABCORP): Prostate Specific Ag, Serum: 1.9 ng/mL (ref 0.0–4.0)

## 2018-01-11 ENCOUNTER — Other Ambulatory Visit: Payer: Self-pay | Admitting: Oncology

## 2018-01-11 ENCOUNTER — Encounter: Payer: Self-pay | Admitting: Oncology

## 2018-07-12 ENCOUNTER — Encounter: Payer: Self-pay | Admitting: Family

## 2018-07-12 ENCOUNTER — Ambulatory Visit: Payer: BLUE CROSS/BLUE SHIELD | Admitting: Family

## 2018-07-12 VITALS — BP 146/88 | HR 80 | Temp 97.3°F | Ht 74.0 in | Wt 200.0 lb

## 2018-07-12 DIAGNOSIS — R300 Dysuria: Secondary | ICD-10-CM

## 2018-07-12 DIAGNOSIS — Z87442 Personal history of urinary calculi: Secondary | ICD-10-CM

## 2018-07-12 LAB — MICROSCOPIC EXAMINATION
Bacteria, UA: NONE SEEN
EPITHELIAL CELLS (NON RENAL): NONE SEEN /HPF (ref 0–10)
Renal Epithel, UA: NONE SEEN /hpf
WBC UA: NONE SEEN /HPF (ref 0–5)

## 2018-07-12 LAB — URINALYSIS, COMPLETE
Bilirubin, UA: NEGATIVE
Glucose, UA: NEGATIVE
KETONES UA: NEGATIVE
Leukocytes, UA: NEGATIVE
Nitrite, UA: NEGATIVE
Protein, UA: NEGATIVE
SPEC GRAV UA: 1.01 (ref 1.005–1.030)
Urobilinogen, Ur: 0.2 mg/dL (ref 0.2–1.0)
pH, UA: 6 (ref 5.0–7.5)

## 2018-07-12 MED ORDER — TAMSULOSIN HCL 0.4 MG PO CAPS: 0.4000 mg | ORAL_CAPSULE | Freq: Every day | ORAL | 3 refills | Status: DC

## 2018-07-12 NOTE — Addendum Note (Signed)
Addended by: Jannifer Rodney A on: 07/12/2018 10:01 AM   Modules accepted: Orders

## 2018-07-12 NOTE — Progress Notes (Signed)
Subjective:    Patient ID: Kyle Gillespie, male    DOB: 1955-08-16, 63 y.o.   MRN: [id number]  Chief Complaint  Patient presents with  . New Patient (Initial Visit)   Pt presents to the office today to reestablish care and complaints of slight dysuria, right lower abdominal pain, and weak stream that he noticed two weeks ago. States these symptoms have resolved after starting Flomax for 5 days. He has hx of kidney stones.    He states he has not been sexually active over the last 6 months and denies any sexual partners.  Dysuria   This is a new problem. The current episode started more than 1 month ago. The problem occurs intermittently. The problem has been waxing and waning. The quality of the pain is described as burning. The pain is at a severity of 2/10. The pain is mild. Pertinent negatives include no flank pain, hematuria, nausea, urgency or vomiting.  Benign Prostatic Hypertrophy  This is a new problem. The current episode started more than 1 month ago. The problem has been waxing and waning since onset. Irritative symptoms include nocturia (1). Irritative symptoms do not include urgency. Obstructive symptoms include dribbling and a weak stream. Obstructive symptoms do not include an intermittent stream or straining. Associated symptoms include dysuria. Pertinent negatives include no hematuria, nausea or vomiting.      Review of Systems  Gastrointestinal: Negative for nausea and vomiting.  Genitourinary: Positive for dysuria and nocturia (1). Negative for flank pain, hematuria and urgency.  All other systems reviewed and are negative.      Objective:   Physical Exam Vitals signs reviewed.  Constitutional:      General: He is not in acute distress.    Appearance: He is well-developed.  HENT:     Head: Normocephalic.     Right Ear: Tympanic membrane normal.     Left Ear: Tympanic membrane normal.  Eyes:     General:        Right eye: No discharge.        Left eye: No  discharge.     Pupils: Pupils are equal, round, and reactive to light.  Neck:     Musculoskeletal: Normal range of motion and neck supple.     Thyroid: No thyromegaly.  Cardiovascular:     Rate and Rhythm: Normal rate and regular rhythm.     Heart sounds: Normal heart sounds. No murmur.  Pulmonary:     Effort: Pulmonary effort is normal. No respiratory distress.     Breath sounds: Normal breath sounds. No wheezing.  Abdominal:     General: Bowel sounds are normal. There is no distension.     Palpations: Abdomen is soft.     Tenderness: There is no abdominal tenderness.  Musculoskeletal: Normal range of motion.        General: No tenderness.  Skin:    General: Skin is warm and dry.     Findings: No erythema or rash.  Neurological:     Mental Status: He is alert and oriented to person, place, and time.     Cranial Nerves: No cranial nerve deficit.     Deep Tendon Reflexes: Reflexes are normal and symmetric.  Psychiatric:        Behavior: Behavior normal.        Thought Content: Thought content normal.        Judgment: Judgment normal.       BP (!) 146/88   Pulse 80  Temp (!) 97.3 F (36.3 C) (Oral)   Ht 6\' 2"  (1.88 m)   Wt 200 lb (90.7 kg)   BMI 25.68 kg/m      Assessment & Plan:  [name] comes in today with chief complaint of New Patient (Initial Visit)   Diagnosis and orders addressed:  1. Dysuria - Urinalysis, Complete - Chlamydia/Gonococcus/Trichomonas, NAA  2. History of kidney stones   More than likely related to passing of kidney stone Pt unable to leave urine today, will bring sample back If infection present will treat. If symptoms continue he may need to start Flomax daily. RTO if symptoms worsen or do not improve    Jannifer Rodney, FNP

## 2018-07-12 NOTE — Patient Instructions (Signed)

## 2018-07-14 LAB — CHLAMYDIA/GONOCOCCUS/TRICHOMONAS, NAA
CHLAMYDIA BY NAA: NEGATIVE
Gonococcus by NAA: NEGATIVE
Trich vag by NAA: NEGATIVE

## 2018-07-29 DIAGNOSIS — H2513 Age-related nuclear cataract, bilateral: Secondary | ICD-10-CM | POA: Diagnosis not present

## 2018-07-29 DIAGNOSIS — H40033 Anatomical narrow angle, bilateral: Secondary | ICD-10-CM | POA: Diagnosis not present

## 2018-08-11 ENCOUNTER — Encounter: Payer: Self-pay | Admitting: Oncology

## 2018-08-16 ENCOUNTER — Ambulatory Visit (INDEPENDENT_AMBULATORY_CARE_PROVIDER_SITE_OTHER): Payer: BLUE CROSS/BLUE SHIELD | Admitting: Family

## 2018-08-16 ENCOUNTER — Encounter: Payer: Self-pay | Admitting: Family

## 2018-08-16 ENCOUNTER — Other Ambulatory Visit: Payer: Self-pay

## 2018-08-16 DIAGNOSIS — Z7189 Other specified counseling: Secondary | ICD-10-CM | POA: Diagnosis not present

## 2018-08-16 NOTE — Progress Notes (Signed)
   Virtual Visit via telephone Note  I connected with Kyle Gillespie on 08/16/18 at 8:31AM  by telephone and verified that I am speaking with the correct person using two identifiers. Kyle Gillespie is currently located at home and no one is currently with her during visit. The provider, Jannifer Rodney, FNP is located in their office at time of visit.  I discussed the limitations, risks, security and privacy concerns of performing an evaluation and management service by telephone and the availability of in person appointments. I also discussed with the patient that there may be a patient responsible charge related to this service. The patient expressed understanding and agreed to proceed.   History and Present Illness:   HPI PT calls today today to discuss work note. He is currently working at [employer]. States with the current COVID, [employer] is extremely busy and is experiencing record number sales.   He states he is not so much worried about himself, but his wife. States his wife is 60 years and is a diabetic, HTN, hypothyroid, and obesity. She is currently recovering from bronchitis.    Review of Systems  All other systems reviewed and are negative.    Observations/Objective: NO  SOB or distress noted  Assessment and Plan: 1. Advice Given About Covid-19 Virus by Telephone After long discussion with patient, I will give him a note for work Given his and his wife's age and medical conditions, they are considered high risk Continue to do good hand hygiene RTO if symptoms worsens or do not improve       I discussed the assessment and treatment plan with the patient. The patient was provided an opportunity to ask questions and all were answered. The patient agreed with the plan and demonstrated an understanding of the instructions.   The patient was advised to call back or seek an in-person evaluation if the symptoms worsen or if the condition fails to improve as anticipated.  The above  assessment and management plan was discussed with the patient. The patient verbalized understanding of and has agreed to the management plan. Patient is aware to call the clinic if symptoms persist or worsen. Patient is aware when to return to the clinic for a follow-up visit. Patient educated on when it is appropriate to go to the emergency department.    Call ended 8:43AM, I provided 12 minutes of non-face-to-face time during this encounter.    Jannifer Rodney, FNP

## 2018-10-22 ENCOUNTER — Other Ambulatory Visit: Payer: Self-pay | Admitting: Oncology

## 2019-01-02 ENCOUNTER — Other Ambulatory Visit: Payer: Self-pay | Admitting: Oncology

## 2019-01-10 NOTE — Progress Notes (Signed)
[facility]  Telephone:(336) [phone] Fax:(336) [phone]   ID: Kyle Gillespie   DOB: 10/27/1955  MR#: [id number]  JYN#:[id number]#:[id number]  Patient Care Team: Junie SpencerHawks, Christy A, FNP as PCP - General (Nurse Practitioner) Ronne BinningMcKenzie, Mardene CelestePatrick L, MD as Consulting Physician (Urology) Carman ChingEdwards, James, MD as Consulting Physician (Gastroenterology) [name], [name], MD as Consulting Physician (Oncology) OTHER MD: Judi CongJerald L Winter MD   CHIEF COMPLAINT: B12 deficiency  CURRENT TREATMENT: monthly B-12 shots   INTERVAL HISTORY: Kyle Gillespie returns today for follow-up and treatment of his B12 deficiency. He was last seen here on 01/06/2018.   He continues on monthly B-12 shots.  He has had no problems obtaining the medication or self administering it  Since his last visit here, he has not undergone any additional studies.   REVIEW OF SYSTEMS: Kyle Gillespie is developing a little bit of a weak urine stream and minimal post void dribbling.  He has been prescribed Flomax by Dr. Lendon ColonelHawks but that makes him feel bad, likely drops his blood pressure some.  He has not had any further episodes of kidney stones.  He and his wife are being very careful regarding the pandemic.  He is busy with projects at home but is not exercising on a regular basis.  Detailed review of systems today was otherwise stable   PAST MEDICAL HISTORY: Past Medical History:  Diagnosis Date  . DDD (degenerative disc disease)   . Dry eyes   . Macular degeneration    stage 1  . Vitamin B 12 deficiency     PAST SURGICAL HISTORY: Past Surgical History:  Procedure Laterality Date  . APPENDECTOMY      FAMILY HISTORY The patient's father died after a stroke at the age of 61. The patient's mother is alive, she is 2194 as of July of 2013. The patient has 2 brothers and one sister. Is not aware of any cancer or blood problems in the family.  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 His wife of 28+ years, Kyle Gillespie, is a Diplomatic Services operational officersecretary at the [employer]. She retired August 2018. They have no children.  The patient is not a church attender.    ADVANCED DIRECTIVES: in place  HEALTH MAINTENANCE: Social History   Tobacco Use  . Smoking status: Never Smoker  . Smokeless tobacco: Never Used  Substance Use Topics  . Alcohol use: No  . Drug use: No     Colonoscopy: December 2016/ Dr. Randa EvensEdwards/ normal  PSA: Stable  Lipid panel: pending  Allergies  Allergen Reactions  . Penicillins Rash  . Morphine And Related Other (See Comments)    Severe headache  . Sulfa Antibiotics Other (See Comments)    Patient states mother had reaction while she was pregnant and patient was instructed not to take.     Current Outpatient Medications  Medication Sig Dispense Refill  . cyanocobalamin (,VITAMIN B-12,) 1000 MCG/ML injection Administer monthly as instructed 12 mL 6  . ibuprofen (ADVIL,MOTRIN) 200 MG tablet Take 200 mg by mouth every 6 (six) hours as needed.    . Multiple Vitamins-Minerals (ICAPS MV PO) Take by mouth.    . tamsulosin (FLOMAX) 0.4 MG CAPS capsule Take 1 capsule (0.4 mg total) by mouth daily. 30 capsule 3   No current facility-administered medications for this visit.     OBJECTIVE: Middle-aged white man in no acute distress  Vitals:   01/11/19 1138  BP:  126/81  Pulse: 74  Resp: 18  Temp: 98.2 F (36.8 C)  SpO2: 100%   Wt Readings from Last 3 Encounters:  01/11/19 193 lb 3.2 oz (87.6 kg)  07/12/18 200 lb (90.7 kg)  01/06/18 191 lb (86.6 kg)   Body mass index is 24.81 kg/m.    ECOG FS:0 - Asymptomatic  Ocular: Sclerae unicteric, pupils round and equal Ear-nose-throat: Wearing a mask Lymphatic: No cervical or supraclavicular adenopathy Lungs no rales or rhonchi Heart regular rate and rhythm Abd soft, nontender, positive bowel sounds No prostate nodularity, no stool in vault MSK no focal spinal  tenderness, no joint edema Neuro: non-focal, well-oriented, appropriate affect     LAB RESULTS: Lab Results  Component Value Date   WBC 4.1 01/11/2019   NEUTROABS 2.3 01/11/2019   HGB 15.5 01/11/2019   HCT 45.8 01/11/2019   MCV 88.9 01/11/2019   PLT 183 01/11/2019      Chemistry      Component Value Date/Time   NA 140 01/11/2019 1125   NA 140 01/06/2017 1123   K 4.5 01/11/2019 1125   K 4.5 01/06/2017 1123   CL 105 01/11/2019 1125   CO2 27 01/11/2019 1125   CO2 28 01/06/2017 1123   BUN 13 01/11/2019 1125   BUN 13.8 01/06/2017 1123   CREATININE 0.90 01/11/2019 1125   CREATININE 0.9 01/06/2017 1123      Component Value Date/Time   CALCIUM 9.2 01/11/2019 1125   CALCIUM 9.7 01/06/2017 1123   ALKPHOS 78 01/11/2019 1125   ALKPHOS 73 01/06/2017 1123   AST 30 01/11/2019 1125   AST 35 (H) 01/06/2017 1123   ALT 43 01/11/2019 1125   ALT 47 01/06/2017 1123   BILITOT 1.1 01/11/2019 1125   BILITOT 1.33 (H) 01/06/2017 1123       No results found for: LABCA2  No components found for: LABCA125  No results for input(s): INR in the last 168 hours.  Urinalysis    Component Value Date/Time   COLORURINE YELLOW 12/18/2014 1230    STUDIES: No results found.    ASSESSMENT: 63 y.o. [place] man with a history of pernicious anemia, on chronic B12 supplementation.  (1) multiple tick bites  (2) guaiac-positive stool-- colonoscopy 04/18/2015 negative  (3)  Nephrolithiasis   PLAN:  Kyle Gillespie has no anemia or macrocytosis and is doing fine with his monthly B12 shots.  I just refilled his prescription.  He is having some symptoms of prostatic hypertrophy.  We had a PSA today pending but his PSA has been stable.  He has had some difficulty with Flomax.  Possibly he might benefit from Proscar if the problem becomes more of an issue  Otherwise he will return to see me in 1 year.  He knows to call for any other issue that may develop before then.   [name], Virgie Dad, MD   01/11/19 12:16 PM Medical Oncology and Hematology [facility] [address] [place], [place] [address] Tel. [phone]    Fax. [phone]  I, Jacqualyn Posey am acting as a Education administrator for Chauncey Cruel, MD.   I, Lurline Del MD, have reviewed the above documentation for accuracy and completeness, and I agree with the above.

## 2019-01-11 ENCOUNTER — Telehealth: Payer: Self-pay | Admitting: Oncology

## 2019-01-11 ENCOUNTER — Inpatient Hospital Stay: Payer: BC Managed Care – PPO | Admitting: Oncology

## 2019-01-11 ENCOUNTER — Other Ambulatory Visit: Payer: Self-pay

## 2019-01-11 ENCOUNTER — Inpatient Hospital Stay: Payer: BC Managed Care – PPO | Attending: Oncology

## 2019-01-11 VITALS — BP 126/81 | HR 74 | Temp 98.2°F | Resp 18 | Wt 193.2 lb

## 2019-01-11 DIAGNOSIS — E538 Deficiency of other specified B group vitamins: Secondary | ICD-10-CM

## 2019-01-11 DIAGNOSIS — N200 Calculus of kidney: Secondary | ICD-10-CM | POA: Insufficient documentation

## 2019-01-11 DIAGNOSIS — M5137 Other intervertebral disc degeneration, lumbosacral region: Secondary | ICD-10-CM

## 2019-01-11 DIAGNOSIS — R3912 Poor urinary stream: Secondary | ICD-10-CM | POA: Diagnosis not present

## 2019-01-11 DIAGNOSIS — D510 Vitamin B12 deficiency anemia due to intrinsic factor deficiency: Secondary | ICD-10-CM | POA: Diagnosis not present

## 2019-01-11 DIAGNOSIS — N400 Enlarged prostate without lower urinary tract symptoms: Secondary | ICD-10-CM | POA: Insufficient documentation

## 2019-01-11 LAB — CBC WITH DIFFERENTIAL/PLATELET
Abs Immature Granulocytes: 0.01 10*3/uL (ref 0.00–0.07)
Basophils Absolute: 0 10*3/uL (ref 0.0–0.1)
Basophils Relative: 0 %
Eosinophils Absolute: 0.1 10*3/uL (ref 0.0–0.5)
Eosinophils Relative: 3 %
HCT: 45.8 % (ref 39.0–52.0)
Hemoglobin: 15.5 g/dL (ref 13.0–17.0)
Immature Granulocytes: 0 %
Lymphocytes Relative: 28 %
Lymphs Abs: 1.2 10*3/uL (ref 0.7–4.0)
MCH: 30.1 pg (ref 26.0–34.0)
MCHC: 33.8 g/dL (ref 30.0–36.0)
MCV: 88.9 fL (ref 80.0–100.0)
Monocytes Absolute: 0.5 10*3/uL (ref 0.1–1.0)
Monocytes Relative: 12 %
Neutro Abs: 2.3 10*3/uL (ref 1.7–7.7)
Neutrophils Relative %: 57 %
Platelets: 183 10*3/uL (ref 150–400)
RBC: 5.15 MIL/uL (ref 4.22–5.81)
RDW: 12.5 % (ref 11.5–15.5)
WBC: 4.1 10*3/uL (ref 4.0–10.5)
nRBC: 0 % (ref 0.0–0.2)

## 2019-01-11 LAB — VITAMIN B12: Vitamin B-12: 204 pg/mL (ref 180–914)

## 2019-01-11 LAB — COMPREHENSIVE METABOLIC PANEL
ALT: 43 U/L (ref 0–44)
AST: 30 U/L (ref 15–41)
Albumin: 4.3 g/dL (ref 3.5–5.0)
Alkaline Phosphatase: 78 U/L (ref 38–126)
Anion gap: 8 (ref 5–15)
BUN: 13 mg/dL (ref 8–23)
CO2: 27 mmol/L (ref 22–32)
Calcium: 9.2 mg/dL (ref 8.9–10.3)
Chloride: 105 mmol/L (ref 98–111)
Creatinine, Ser: 0.9 mg/dL (ref 0.61–1.24)
GFR calc Af Amer: >60 mL/min (ref >60)
GFR calc non Af Amer: >60 mL/min (ref >60)
Glucose, Bld: 94 mg/dL (ref 70–99)
Potassium: 4.5 mmol/L (ref 3.5–5.1)
Sodium: 140 mmol/L (ref 135–145)
Total Bilirubin: 1.1 mg/dL (ref 0.3–1.2)
Total Protein: 7.1 g/dL (ref 6.5–8.1)

## 2019-01-11 MED ORDER — CYANOCOBALAMIN 1000 MCG/ML IJ SOLN: INTRAMUSCULAR | 6 refills | Status: DC

## 2019-01-11 NOTE — Telephone Encounter (Signed)
Scheduled per 09/02 los, patient received after visit summary report and will see upcoming appointments on My chart.

## 2019-01-12 ENCOUNTER — Encounter: Payer: Self-pay | Admitting: Oncology

## 2019-01-12 LAB — PROSTATE-SPECIFIC AG, SERUM (LABCORP): Prostate Specific Ag, Serum: 2.1 ng/mL (ref 0.0–4.0)

## 2019-07-31 DIAGNOSIS — Z23 Encounter for immunization: Secondary | ICD-10-CM | POA: Diagnosis not present

## 2019-08-30 DIAGNOSIS — Z23 Encounter for immunization: Secondary | ICD-10-CM | POA: Diagnosis not present

## 2020-01-10 NOTE — Progress Notes (Signed)
[facility]  Telephone:(336) [phone] Fax:(336) [phone]   ID: Kyle Gillespie   DOB: 12/31/1955  MR#: [id number]  YWV#:[id number]  Patient Care Team: Junie Spencer, FNP as PCP - General (Nurse Practitioner) Ronne Binning Mardene Celeste, MD as Consulting Physician (Urology) Carman Ching, MD (Inactive) as Consulting Physician (Gastroenterology) Katoria Yetman, Valentino Hue, MD as Consulting Physician (Oncology) OTHER MD: Judi Cong MD   CHIEF COMPLAINT: B12 deficiency  CURRENT TREATMENT: monthly B-12 shots   INTERVAL HISTORY: Kyle Gillespie returns today for follow-up and treatment of his B12 deficiency.   He continues on monthly B-12 shots.  He has had no problems obtaining the medication or self administering it.  He obtains it at a good price  Since his last visit here, he has not undergone any additional studies.   REVIEW OF SYSTEMS: Kyle Gillespie is now fully retired.  He is not exercising regularly.  He takes a walk sometimes, perhaps once a week.  He has gained a little bit of weight and would like to get back to about 175-180 if he can.  He received the COVID-19 vaccine x2 without any complications.  A detailed review of systems today was otherwise stable.   PAST MEDICAL HISTORY: Past Medical History:  Diagnosis Date  . DDD (degenerative disc disease)   . Dry eyes   . Macular degeneration    stage 1  . Vitamin B 12 deficiency     PAST SURGICAL HISTORY: Past Surgical History:  Procedure Laterality Date  . APPENDECTOMY      FAMILY HISTORY The patient's father died after a stroke at the age of 24. The patient's mother died at age 27. The patient has 2 brothers and one sister.  He is not aware of any cancer or blood problems in the family.   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 His wife of 28+ years, [name], was a Diplomatic Services operational officer at the [employer]. She retired August 2018. They have no children.  The patient is not a church attender.    ADVANCED DIRECTIVES: in place   HEALTH MAINTENANCE: Social History   Tobacco Use  . Smoking status: Never Smoker  . Smokeless tobacco: Never Used  Vaping Use  . Vaping Use: Never used  Substance Use Topics  . Alcohol use: No  . Drug use: No     Colonoscopy: December 2016/ Dr. [name] normal  PSA: Stable  Lipid panel: pending  Allergies  Allergen Reactions  . Penicillins Rash  . Morphine And Related Other (See Comments)    Severe headache  . Sulfa Antibiotics Other (See Comments)    Patient states mother had reaction while she was pregnant and patient was instructed not to take.     Current Outpatient Medications  Medication Sig Dispense Refill  . cyanocobalamin (,VITAMIN B-12,) 1000 MCG/ML injection Administer monthly as instructed 12 mL 6  . ibuprofen (ADVIL,MOTRIN) 200 MG tablet Take 200 mg by mouth every 6 (six) hours as needed.    . Multiple Vitamins-Minerals (ICAPS MV PO) Take by mouth.     No current facility-administered medications for this visit.    OBJECTIVE: White man who appears well  Vitals:   01/11/20 1245  BP: (!) 144/82  Pulse: 75  Resp: 16  Temp: (!) 97.3 F (36.3 C)  SpO2: 98%   Wt Readings from Last 3 Encounters:  01/11/20 197 lb 6.4 oz (  89.5 kg)  01/11/19 193 lb 3.2 oz (87.6 kg)  07/12/18 200 lb (90.7 kg)   Body mass index is 26.77 kg/m.    ECOG FS:0 - Asymptomatic  Sclerae unicteric, EOMs intact Wearing a mask No cervical or supraclavicular adenopathy Lungs no rales or rhonchi Heart regular rate and rhythm Abd soft, nontender, positive bowel sounds Rectal is guaiac negative, prostate not nodular, mildly enlarged MSK no focal spinal tenderness, no upper extremity lymphedema Neuro: nonfocal, well oriented, appropriate affect   LAB RESULTS: Lab Results  Component Value Date   WBC 4.4 01/11/2020   NEUTROABS 2.6 01/11/2020    HGB 16.4 01/11/2020   HCT 47.5 01/11/2020   MCV 89.1 01/11/2020   PLT 220 01/11/2020      Chemistry      Component Value Date/Time   NA 143 01/11/2020 1224   NA 140 01/06/2017 1123   K 4.5 01/11/2020 1224   K 4.5 01/06/2017 1123   CL 106 01/11/2020 1224   CO2 27 01/11/2020 1224   CO2 28 01/06/2017 1123   BUN 10 01/11/2020 1224   BUN 13.8 01/06/2017 1123   CREATININE 0.92 01/11/2020 1224   CREATININE 0.9 01/06/2017 1123      Component Value Date/Time   CALCIUM PENDING 01/11/2020 1224   CALCIUM 9.7 01/06/2017 1123   ALKPHOS 106 01/11/2020 1224   ALKPHOS 73 01/06/2017 1123   AST 30 01/11/2020 1224   AST 35 (H) 01/06/2017 1123   ALT 42 01/11/2020 1224   ALT 47 01/06/2017 1123   BILITOT 1.2 01/11/2020 1224   BILITOT 1.33 (H) 01/06/2017 1123       No results found for: LABCA2  No components found for: LABCA125  No results for input(s): INR in the last 168 hours.  Urinalysis    Component Value Date/Time   COLORURINE YELLOW 12/18/2014 1230    STUDIES: No results found.    ASSESSMENT: 64 y.o. Kyle Gillespie man with a history of pernicious anemia, on chronic B12 supplementation.  (1) multiple tick bites  (2) guaiac-positive stool-- colonoscopy 04/18/2015 negative  (3)  Nephrolithiasis   PLAN:  Kyle Gillespie is doing fine from a general health standpoint except that his LDL is a little bit higher than I would like (it was just over 130 last time; we are rechecking it today).  He has also gained a little bit of weight since he retired.  I suggested he cut back on potatoes rice pasta and bread and eat more vegetables and he can eat meat and dairy as much as he wants.  He also needs to exercise more regularly.  I know it is hot out there but walking in the evening will not hurt anything.    He will see me again in a year.  He knows to call for any other issue that may develop before then  Total encounter time 20 minutes.*  Anelise Staron, Valentino Hue, MD  01/11/20 1:18 PM Medical  Oncology and Hematology [facility] [address] [place], [place] [address] Tel. [phone]    Fax. [phone]   I, Mickie Bail, am acting as scribe for Dr. Valentino Hue. Valori Hollenkamp.  I, Ruthann Cancer MD, have reviewed the above documentation for accuracy and completeness, and I agree with the above.    *Total Encounter Time as defined by the Centers for Medicare and Medicaid Services includes, in addition to the face-to-face time of a patient visit (documented in the note above) non-face-to-face time: obtaining and reviewing outside history, ordering and  reviewing medications, tests or procedures, care coordination (communications with other health care professionals or caregivers) and documentation in the medical record.  

## 2020-01-11 ENCOUNTER — Other Ambulatory Visit: Payer: Self-pay

## 2020-01-11 ENCOUNTER — Inpatient Hospital Stay: Payer: BC Managed Care – PPO | Attending: Oncology

## 2020-01-11 ENCOUNTER — Inpatient Hospital Stay: Payer: BC Managed Care – PPO | Admitting: Oncology

## 2020-01-11 VITALS — BP 144/82 | HR 75 | Temp 97.3°F | Resp 16 | Ht 72.0 in | Wt 197.4 lb

## 2020-01-11 DIAGNOSIS — Z79899 Other long term (current) drug therapy: Secondary | ICD-10-CM | POA: Diagnosis not present

## 2020-01-11 DIAGNOSIS — M5137 Other intervertebral disc degeneration, lumbosacral region: Secondary | ICD-10-CM

## 2020-01-11 DIAGNOSIS — N200 Calculus of kidney: Secondary | ICD-10-CM | POA: Diagnosis not present

## 2020-01-11 DIAGNOSIS — E538 Deficiency of other specified B group vitamins: Secondary | ICD-10-CM

## 2020-01-11 DIAGNOSIS — D510 Vitamin B12 deficiency anemia due to intrinsic factor deficiency: Secondary | ICD-10-CM | POA: Diagnosis not present

## 2020-01-11 DIAGNOSIS — E7800 Pure hypercholesterolemia, unspecified: Secondary | ICD-10-CM | POA: Insufficient documentation

## 2020-01-11 DIAGNOSIS — W57XXXA Bitten or stung by nonvenomous insect and other nonvenomous arthropods, initial encounter: Secondary | ICD-10-CM | POA: Insufficient documentation

## 2020-01-11 DIAGNOSIS — Z823 Family history of stroke: Secondary | ICD-10-CM | POA: Insufficient documentation

## 2020-01-11 LAB — LIPID PANEL
Cholesterol: 180 mg/dL (ref 0–200)
HDL: 47 mg/dL (ref >40)
LDL Cholesterol: 120 mg/dL — ABNORMAL HIGH (ref 0–99)
Total CHOL/HDL Ratio: 3.8 RATIO
Triglycerides: 64 mg/dL (ref <150)
VLDL: 13 mg/dL (ref 0–40)

## 2020-01-11 LAB — CBC WITH DIFFERENTIAL/PLATELET
Abs Immature Granulocytes: 0.01 10*3/uL (ref 0.00–0.07)
Basophils Absolute: 0 10*3/uL (ref 0.0–0.1)
Basophils Relative: 0 %
Eosinophils Absolute: 0.1 10*3/uL (ref 0.0–0.5)
Eosinophils Relative: 3 %
HCT: 47.5 % (ref 39.0–52.0)
Hemoglobin: 16.4 g/dL (ref 13.0–17.0)
Immature Granulocytes: 0 %
Lymphocytes Relative: 26 %
Lymphs Abs: 1.2 10*3/uL (ref 0.7–4.0)
MCH: 30.8 pg (ref 26.0–34.0)
MCHC: 34.5 g/dL (ref 30.0–36.0)
MCV: 89.1 fL (ref 80.0–100.0)
Monocytes Absolute: 0.5 10*3/uL (ref 0.1–1.0)
Monocytes Relative: 12 %
Neutro Abs: 2.6 10*3/uL (ref 1.7–7.7)
Neutrophils Relative %: 59 %
Platelets: 220 10*3/uL (ref 150–400)
RBC: 5.33 MIL/uL (ref 4.22–5.81)
RDW: 12.7 % (ref 11.5–15.5)
WBC: 4.4 10*3/uL (ref 4.0–10.5)
nRBC: 0 % (ref 0.0–0.2)

## 2020-01-11 LAB — COMPREHENSIVE METABOLIC PANEL
ALT: 42 U/L (ref 0–44)
AST: 30 U/L (ref 15–41)
Albumin: 4.3 g/dL (ref 3.5–5.0)
Alkaline Phosphatase: 106 U/L (ref 38–126)
Anion gap: 10 (ref 5–15)
BUN: 10 mg/dL (ref 8–23)
CO2: 27 mmol/L (ref 22–32)
Calcium: 9.5 mg/dL (ref 8.9–10.3)
Chloride: 106 mmol/L (ref 98–111)
Creatinine, Ser: 0.92 mg/dL (ref 0.61–1.24)
GFR calc Af Amer: >60 mL/min (ref >60)
GFR calc non Af Amer: >60 mL/min (ref >60)
Glucose, Bld: 93 mg/dL (ref 70–99)
Potassium: 4.5 mmol/L (ref 3.5–5.1)
Sodium: 143 mmol/L (ref 135–145)
Total Bilirubin: 1.2 mg/dL (ref 0.3–1.2)
Total Protein: 7.5 g/dL (ref 6.5–8.1)

## 2020-01-11 LAB — VITAMIN B12: Vitamin B-12: 292 pg/mL (ref 180–914)

## 2020-01-11 MED ORDER — CYANOCOBALAMIN 1000 MCG/ML IJ SOLN: INTRAMUSCULAR | 6 refills | Status: DC

## 2020-01-12 LAB — PROSTATE-SPECIFIC AG, SERUM (LABCORP): Prostate Specific Ag, Serum: 2.7 ng/mL (ref 0.0–4.0)

## 2020-03-01 DIAGNOSIS — M25511 Pain in right shoulder: Secondary | ICD-10-CM | POA: Diagnosis not present

## 2020-03-01 DIAGNOSIS — M7541 Impingement syndrome of right shoulder: Secondary | ICD-10-CM | POA: Diagnosis not present

## 2020-03-28 ENCOUNTER — Other Ambulatory Visit: Payer: Self-pay

## 2020-03-28 ENCOUNTER — Ambulatory Visit (INDEPENDENT_AMBULATORY_CARE_PROVIDER_SITE_OTHER): Payer: BC Managed Care – PPO

## 2020-03-28 DIAGNOSIS — Z23 Encounter for immunization: Secondary | ICD-10-CM

## 2020-03-29 ENCOUNTER — Encounter: Payer: Self-pay | Admitting: *Deleted

## 2020-04-16 DIAGNOSIS — Z23 Encounter for immunization: Secondary | ICD-10-CM | POA: Diagnosis not present

## 2020-06-03 ENCOUNTER — Telehealth: Payer: Self-pay

## 2020-06-04 ENCOUNTER — Ambulatory Visit (INDEPENDENT_AMBULATORY_CARE_PROVIDER_SITE_OTHER): Payer: BC Managed Care – PPO | Admitting: Family

## 2020-06-04 ENCOUNTER — Encounter: Payer: Self-pay | Admitting: Family

## 2020-06-04 ENCOUNTER — Ambulatory Visit: Payer: BC Managed Care – PPO | Admitting: Family

## 2020-06-04 VITALS — BP 153/86 | HR 87 | Temp 97.9°F | Ht 72.0 in | Wt 200.0 lb

## 2020-06-04 DIAGNOSIS — R030 Elevated blood-pressure reading, without diagnosis of hypertension: Secondary | ICD-10-CM | POA: Diagnosis not present

## 2020-06-04 DIAGNOSIS — E663 Overweight: Secondary | ICD-10-CM

## 2020-06-04 DIAGNOSIS — J069 Acute upper respiratory infection, unspecified: Secondary | ICD-10-CM | POA: Diagnosis not present

## 2020-06-04 DIAGNOSIS — R059 Cough, unspecified: Secondary | ICD-10-CM | POA: Diagnosis not present

## 2020-06-04 MED ORDER — FLUTICASONE PROPIONATE 50 MCG/ACT NA SUSP: 2.0000 | Freq: Every day | NASAL | 6 refills | Status: AC

## 2020-06-04 NOTE — Progress Notes (Signed)
Subjective:    Patient ID: Kyle Gillespie, male    DOB: 05/30/1955, 65 y.o.   MRN: [id number]  Chief Complaint  Patient presents with  . Hypertension    Taken decongestants   . Nasal Congestion    Sunday   . Headache    Hypertension This is a new problem. The current episode started more than 1 month ago. The problem has been waxing and waning since onset. Associated symptoms include headaches and malaise/fatigue. Pertinent negatives include no peripheral edema or shortness of breath.  Headache  Associated symptoms include sinus pressure. Pertinent negatives include no coughing, ear pain or sore throat. His past medical history is significant for hypertension.  Sinusitis This is a new problem. The current episode started in the past 7 days. The problem has been gradually improving since onset. There has been no fever. His pain is at a severity of 2/10. The pain is mild. Associated symptoms include congestion, headaches, a hoarse voice and sinus pressure. Pertinent negatives include no coughing, ear pain, shortness of breath, sneezing or sore throat. Treatments tried: mucinex. The treatment provided mild relief.      Review of Systems  Constitutional: Positive for malaise/fatigue.  HENT: Positive for congestion, hoarse voice and sinus pressure. Negative for ear pain, sneezing and sore throat.   Respiratory: Negative for cough and shortness of breath.   Neurological: Positive for headaches.  All other systems reviewed and are negative.      Objective:   Physical Exam Vitals reviewed.  Constitutional:      General: He is not in acute distress.    Appearance: He is well-developed and well-nourished.  HENT:     Head: Normocephalic.     Right Ear: External ear normal.     Left Ear: External ear normal.     Mouth/Throat:     Mouth: Oropharynx is clear and moist.  Eyes:     General:        Right eye: No discharge.        Left eye: No discharge.     Pupils: Pupils are equal,  round, and reactive to light.  Neck:     Thyroid: No thyromegaly.  Cardiovascular:     Rate and Rhythm: Normal rate and regular rhythm.     Pulses: Intact distal pulses.     Heart sounds: Normal heart sounds. No murmur heard.   Pulmonary:     Effort: Pulmonary effort is normal. No respiratory distress.     Breath sounds: Normal breath sounds. No wheezing.  Abdominal:     General: Bowel sounds are normal. There is no distension.     Palpations: Abdomen is soft.     Tenderness: There is no abdominal tenderness.  Musculoskeletal:        General: No tenderness or edema. Normal range of motion.     Cervical back: Normal range of motion and neck supple.  Skin:    General: Skin is warm and dry.     Findings: No erythema or rash.  Neurological:     Mental Status: He is alert and oriented to person, place, and time.     Cranial Nerves: No cranial nerve deficit.     Deep Tendon Reflexes: Reflexes are normal and symmetric.  Psychiatric:        Mood and Affect: Mood and affect normal.        Behavior: Behavior normal.        Thought Content: Thought content normal.  Judgment: Judgment normal.       BP (!) 163/92   Pulse 87   Temp 97.9 F (36.6 C) (Temporal)   Ht 6' (1.829 m)   Wt 200 lb (90.7 kg)   BMI 27.12 kg/m      Assessment & Plan:  Kyle Gillespie comes in today with chief complaint of Hypertension (Taken decongestants ), Nasal Congestion (Sunday/), and Headache   Diagnosis and orders addressed:  1. Cough - Novel Coronavirus, NAA ([facility])  2. Elevated blood pressure reading Will monitor at home RTO in 2 weeks to recheck  3. Overweight (BMI 25.0-29.9)  4. Viral URI - Take meds as prescribed - Use a cool mist humidifier  -Use saline nose sprays frequently -Force fluids -For any cough or congestion  Use plain Mucinex- regular strength or max strength is fine -For fever or aces or pains- take tylenol or ibuprofen. -Throat lozenges if help -Call if  symptoms worsen or do not improve  - fluticasone (FLONASE) 50 MCG/ACT nasal spray; Place 2 sprays into both nostrils daily.  Dispense: 16 g; Refill: 6   Jannifer Rodney, FNP

## 2020-06-04 NOTE — Patient Instructions (Signed)
Upper Respiratory Infection, Adult An upper respiratory infection (URI) is a common viral infection of the nose, throat, and upper air passages that lead to the lungs. The most common type of URI is the common cold. URIs usually get better on their own, without medical treatment. What are the causes? A URI is caused by a virus. You may catch a virus by:  Breathing in droplets from an infected person's cough or sneeze.  Touching something that has been exposed to the virus (contaminated) and then touching your mouth, nose, or eyes. What increases the risk? You are more likely to get a URI if:  You are very young or very old.  It is autumn or winter.  You have close contact with others, such as at a daycare, school, or health care facility.  You smoke.  You have long-term (chronic) heart or lung disease.  You have a weakened disease-fighting (immune) system.  You have nasal allergies or asthma.  You are experiencing a lot of stress.  You work in an area that has poor air circulation.  You have poor nutrition. What are the signs or symptoms? A URI usually involves some of the following symptoms:  Runny or stuffy (congested) nose.  Sneezing.  Cough.  Sore throat.  Headache.  Fatigue.  Fever.  Loss of appetite.  Pain in your forehead, behind your eyes, and over your cheekbones (sinus pain).  Muscle aches.  Redness or irritation of the eyes.  Pressure in the ears or face. How is this diagnosed? This condition may be diagnosed based on your medical history and symptoms, and a physical exam. Your health care provider may use a cotton swab to take a mucus sample from your nose (nasal swab). This sample can be tested to determine what virus is causing the illness. How is this treated? URIs usually get better on their own within 7-10 days. You can take steps at home to relieve your symptoms. Medicines cannot cure URIs, but your health care provider may recommend  certain medicines to help relieve symptoms, such as:  Over-the-counter cold medicines.  Cough suppressants. Coughing is a type of defense against infection that helps to clear the respiratory system, so take these medicines only as recommended by your health care provider.  Fever-reducing medicines. Follow these instructions at home: Activity  Rest as needed.  If you have a fever, stay home from work or school until your fever is gone or until your health care provider says you are no longer contagious. Your health care provider may have you wear a face mask to prevent your infection from spreading. Relieving symptoms  Gargle with a salt-water mixture 3-4 times a day or as needed. To make a salt-water mixture, completely dissolve -1 tsp of salt in 1 cup of warm water.  Use a cool-mist humidifier to add moisture to the air. This can help you breathe more easily. Eating and drinking  Drink enough fluid to keep your urine pale yellow.  Eat soups and other clear broths.   General instructions  Take over-the-counter and prescription medicines only as told by your health care provider. These include cold medicines, fever reducers, and cough suppressants.  Do not use any products that contain nicotine or tobacco, such as cigarettes and e-cigarettes. If you need help quitting, ask your health care provider.  Stay away from secondhand smoke.  Stay up to date on all immunizations, including the yearly (annual) flu vaccine.  Keep all follow-up visits as told by your health   care provider. This is important.   How to prevent the spread of infection to others  URIs can be passed from person to person (are contagious). To prevent the infection from spreading: ? Wash your hands often with soap and water. If soap and water are not available, use hand sanitizer. ? Avoid touching your mouth, face, eyes, or nose. ? Cough or sneeze into a tissue or your sleeve or elbow instead of into your hand or  into the air.   Contact a health care provider if:  You are getting worse instead of better.  You have a fever or chills.  Your mucus is brown or red.  You have yellow or brown discharge coming from your nose.  You have pain in your face, especially when you bend forward.  You have swollen neck glands.  You have pain while swallowing.  You have white areas in the back of your throat. Get help right away if:  You have shortness of breath that gets worse.  You have severe or persistent: ? Headache. ? Ear pain. ? Sinus pain. ? Chest pain.  You have chronic lung disease along with any of the following: ? Wheezing. ? Prolonged cough. ? Coughing up blood. ? A change in your usual mucus.  You have a stiff neck.  You have changes in your: ? Vision. ? Hearing. ? Thinking. ? Mood. Summary  An upper respiratory infection (URI) is a common infection of the nose, throat, and upper air passages that lead to the lungs.  A URI is caused by a virus.  URIs usually get better on their own within 7-10 days.  Medicines cannot cure URIs, but your health care provider may recommend certain medicines to help relieve symptoms. This information is not intended to replace advice given to you by your health care provider. Make sure you discuss any questions you have with your health care provider. Document Revised: 01/04/2020 Document Reviewed: 01/04/2020 Elsevier Patient Education  2021 [employer]  

## 2020-06-06 LAB — NOVEL CORONAVIRUS, NAA: SARS-CoV-2, NAA: NOT DETECTED

## 2020-06-06 LAB — SARS-COV-2, NAA 2 DAY TAT

## 2020-06-10 ENCOUNTER — Other Ambulatory Visit: Payer: Self-pay | Admitting: Family

## 2020-06-10 MED ORDER — DOXYCYCLINE HYCLATE 100 MG PO TABS: 100.0000 mg | ORAL_TABLET | Freq: Two times a day (BID) | ORAL | 0 refills | Status: DC

## 2020-06-12 NOTE — Telephone Encounter (Signed)
Spoke with patient, he had an appointment with Jannifer Rodney to discuss his blood pressure and has a follow up on 06/17/20.

## 2020-06-17 ENCOUNTER — Ambulatory Visit: Payer: BC Managed Care – PPO | Admitting: Family

## 2020-06-17 ENCOUNTER — Encounter: Payer: Self-pay | Admitting: Family

## 2020-06-17 ENCOUNTER — Other Ambulatory Visit: Payer: Self-pay

## 2020-06-17 VITALS — BP 137/82 | HR 90 | Temp 97.0°F | Ht 72.0 in | Wt 201.4 lb

## 2020-06-17 DIAGNOSIS — R030 Elevated blood-pressure reading, without diagnosis of hypertension: Secondary | ICD-10-CM

## 2020-06-17 DIAGNOSIS — E538 Deficiency of other specified B group vitamins: Secondary | ICD-10-CM

## 2020-06-17 NOTE — Progress Notes (Signed)
Subjective:    Patient ID: OSTEN JANEK, male    DOB: 1955-06-06, 65 y.o.   MRN: [id number]  Chief Complaint  Patient presents with  . Blood Pressure Check    HPI PT presents to the office today to recheck elevated BP. He was seen on 06/04/20 with cough. His COVID test was negative and given Doxycycline. He reports he stopped this on 06/13/20.   His BP today is at goal. He has been monitoring at home and his BP has been 120's-150's/80's. Denies any headaches, SOB, swelling, or palpitations.   Requesting Vit B 12 to be checked. States he gives himself monthly injections   Review of Systems  All other systems reviewed and are negative.      Objective:   Physical Exam Vitals reviewed.  Constitutional:      General: He is not in acute distress.    Appearance: He is well-developed and well-nourished.  HENT:     Head: Normocephalic.     Right Ear: Tympanic membrane normal.     Left Ear: Tympanic membrane normal.     Mouth/Throat:     Mouth: Oropharynx is clear and moist.  Eyes:     General:        Right eye: No discharge.        Left eye: No discharge.     Pupils: Pupils are equal, round, and reactive to light.  Neck:     Thyroid: No thyromegaly.  Cardiovascular:     Rate and Rhythm: Normal rate and regular rhythm.     Pulses: Intact distal pulses.     Heart sounds: Normal heart sounds. No murmur heard.   Pulmonary:     Effort: Pulmonary effort is normal. No respiratory distress.     Breath sounds: Normal breath sounds. No wheezing.  Abdominal:     General: Bowel sounds are normal. There is no distension.     Palpations: Abdomen is soft.     Tenderness: There is no abdominal tenderness.  Musculoskeletal:        General: No tenderness or edema. Normal range of motion.     Cervical back: Normal range of motion and neck supple.  Skin:    General: Skin is warm and dry.     Findings: No erythema or rash.  Neurological:     Mental Status: He is alert and oriented to  person, place, and time.     Cranial Nerves: No cranial nerve deficit.     Deep Tendon Reflexes: Reflexes are normal and symmetric.  Psychiatric:        Mood and Affect: Mood and affect normal.        Behavior: Behavior normal.        Thought Content: Thought content normal.        Judgment: Judgment normal.          BP 137/82   Pulse 90   Temp (!) 97 F (36.1 C)   Ht 6' (1.829 m)   Wt 201 lb 6.4 oz (91.4 kg)   SpO2 97%   BMI 27.31 kg/m   Assessment & Plan:  DAMAURI MINION comes in today with chief complaint of Blood Pressure Check   Diagnosis and orders addressed:  1. Elevated blood pressure reading At goal today Continue to monitor at home - BMP8+EGFR  2. Vitamin B 12 deficiency - Vitamin B12 - BMP8+EGFR   Labs pending Health Maintenance reviewed Diet and exercise encouraged  Follow up plan: 6  months   Evelina Dun, FNP

## 2020-06-17 NOTE — Patient Instructions (Signed)
Vitamin B12 Deficiency Vitamin B12 deficiency occurs when the body does not have enough vitamin B12, which is an important vitamin. The body needs this vitamin:  To make red blood cells.  To make DNA. This is the genetic material inside cells.  To help the nerves work properly so they can carry messages from the brain to the body. Vitamin B12 deficiency can cause various health problems, such as a low red blood cell count (anemia) or nerve damage. What are the causes? This condition may be caused by:  Not eating enough foods that contain vitamin B12.  Not having enough stomach acid and digestive fluids to properly absorb vitamin B12 from the food that you eat.  Certain digestive system diseases that make it hard to absorb vitamin B12. These diseases include Crohn's disease, chronic pancreatitis, and cystic fibrosis.  A condition in which the body does not make enough of a protein (intrinsic factor), resulting in too few red blood cells (pernicious anemia).  Having a surgery in which part of the stomach or small intestine is removed.  Taking certain medicines that make it hard for the body to absorb vitamin B12. These medicines include: ? Heartburn medicines (antacids and proton pump inhibitors). ? Certain antibiotic medicines. ? Some medicines that are used to treat diabetes, tuberculosis, gout, or high cholesterol. What increases the risk? The following factors may make you more likely to develop a B12 deficiency:  Being older than age 50.  Eating a vegetarian or vegan diet, especially while you are pregnant.  Eating a poor diet while you are pregnant.  Taking certain medicines.  Having alcoholism. What are the signs or symptoms? In some cases, there are no symptoms of this condition. If the condition leads to anemia or nerve damage, various symptoms can occur, such as:  Weakness.  Fatigue.  Loss of appetite.  Weight loss.  Numbness or tingling in your hands and  feet.  Redness and burning of the tongue.  Confusion or memory problems.  Depression.  Sensory problems, such as color blindness, ringing in the ears, or loss of taste.  Diarrhea or constipation.  Trouble walking. If anemia is severe, symptoms can include:  Shortness of breath.  Dizziness.  Rapid heart rate (tachycardia). How is this diagnosed? This condition may be diagnosed with a blood test to measure the level of vitamin B12 in your blood. You may also have other tests, including:  A group of tests that measure certain characteristics of blood cells (complete blood count, CBC).  A blood test to measure intrinsic factor.  A procedure where a thin tube with a camera on the end is used to look into your stomach or intestines (endoscopy). Other tests may be needed to discover the cause of B12 deficiency. How is this treated? Treatment for this condition depends on the cause. This condition may be treated by:  Changing your eating and drinking habits, such as: ? Eating more foods that contain vitamin B12. ? Drinking less alcohol or no alcohol.  Getting vitamin B12 injections.  Taking vitamin B12 supplements. Your health care provider will tell you which dosage is best for you. Follow these instructions at home: Eating and drinking  Eat lots of healthy foods that contain vitamin B12, including: ? Meats and poultry. This includes beef, pork, chicken, turkey, and organ meats, such as liver. ? Seafood. This includes clams, rainbow trout, salmon, tuna, and haddock. ? Eggs. ? Cereal and dairy products that are fortified. This means that vitamin B12 has   been added to the food. Check the label on the package to see if the food is fortified. The items listed above may not be a complete list of recommended foods and beverages. Contact a dietitian for more information.   General instructions  Get any injections that are prescribed by your health care provider.  Take  supplements only as told by your health care provider. Follow the directions carefully.  Do not drink alcohol if your health care provider tells you not to. In some cases, you may only be asked to limit alcohol use.  Keep all follow-up visits as told by your health care provider. This is important. Contact a health care provider if:  Your symptoms come back. Get help right away if you:  Develop shortness of breath.  Have a rapid heart rate.  Have chest pain.  Become dizzy or lose consciousness. Summary  Vitamin B12 deficiency occurs when the body does not have enough vitamin B12.  The main causes of vitamin B12 deficiency include dietary deficiency, digestive diseases, pernicious anemia, and having a surgery in which part of the stomach or small intestine is removed.  In some cases, there are no symptoms of this condition. If the condition leads to anemia or nerve damage, various symptoms can occur, such as weakness, shortness of breath, and numbness.  Treatment may include getting vitamin B12 injections or taking vitamin B12 supplements. Eat lots of healthy foods that contain vitamin B12. This information is not intended to replace advice given to you by your health care provider. Make sure you discuss any questions you have with your health care provider. Document Revised: 10/14/2018 Document Reviewed: 01/04/2018 Elsevier Patient Education  2021 [employer] Inc.  

## 2020-06-18 LAB — BMP8+EGFR
BUN/Creatinine Ratio: 11 (ref 10–24)
BUN: 10 mg/dL (ref 8–27)
CO2: 25 mmol/L (ref 20–29)
Calcium: 10 mg/dL (ref 8.6–10.2)
Chloride: 102 mmol/L (ref 96–106)
Creatinine, Ser: 0.88 mg/dL (ref 0.76–1.27)
GFR calc Af Amer: 106 mL/min/{1.73_m2} (ref >59)
GFR calc non Af Amer: 92 mL/min/{1.73_m2} (ref >59)
Glucose: 96 mg/dL (ref 65–99)
Potassium: 4.8 mmol/L (ref 3.5–5.2)
Sodium: 142 mmol/L (ref 134–144)

## 2020-06-18 LAB — VITAMIN B12: Vitamin B-12: 804 pg/mL (ref 232–1245)

## 2020-06-27 ENCOUNTER — Other Ambulatory Visit: Payer: Self-pay | Admitting: Family

## 2020-06-27 ENCOUNTER — Telehealth: Payer: Self-pay

## 2020-06-27 NOTE — Telephone Encounter (Signed)
Made appt for patient.  

## 2020-06-28 ENCOUNTER — Encounter: Payer: Self-pay | Admitting: Family

## 2020-06-28 ENCOUNTER — Other Ambulatory Visit: Payer: Self-pay

## 2020-06-28 ENCOUNTER — Ambulatory Visit: Payer: BC Managed Care – PPO | Admitting: Family

## 2020-06-28 VITALS — BP 130/78 | HR 92 | Temp 98.1°F | Ht 72.0 in | Wt 201.4 lb

## 2020-06-28 DIAGNOSIS — R002 Palpitations: Secondary | ICD-10-CM

## 2020-06-28 DIAGNOSIS — R030 Elevated blood-pressure reading, without diagnosis of hypertension: Secondary | ICD-10-CM

## 2020-06-28 NOTE — Patient Instructions (Signed)

## 2020-06-28 NOTE — Progress Notes (Signed)
Subjective:    Patient ID: Kyle Gillespie, male    DOB: April 10, 1956, 65 y.o.   MRN: [id number]  Chief Complaint  Patient presents with  . Hypertension    Goes up and down     Hypertension This is a recurrent problem. The current episode started more than 1 year ago. The problem has been resolved since onset. The problem is controlled. Associated symptoms include palpitations. Pertinent negatives include no malaise/fatigue, peripheral edema or shortness of breath. Risk factors for coronary artery disease include dyslipidemia, obesity and sedentary lifestyle. The current treatment provides moderate improvement.  Palpitations  This is a new problem. The current episode started in the past 7 days. The problem has been resolved. Pertinent negatives include no malaise/fatigue or shortness of breath.      Review of Systems  Constitutional: Negative for malaise/fatigue.  Respiratory: Negative for shortness of breath.   Cardiovascular: Positive for palpitations.  All other systems reviewed and are negative.      Objective:   Physical Exam Vitals reviewed.  Constitutional:      General: He is not in acute distress.    Appearance: He is well-developed and well-nourished.  HENT:     Head: Normocephalic.     Right Ear: Tympanic membrane normal.     Left Ear: Tympanic membrane normal.     Mouth/Throat:     Mouth: Oropharynx is clear and moist.  Eyes:     General:        Right eye: No discharge.        Left eye: No discharge.     Pupils: Pupils are equal, round, and reactive to light.  Neck:     Thyroid: No thyromegaly.  Cardiovascular:     Rate and Rhythm: Normal rate and regular rhythm.     Pulses: Intact distal pulses.     Heart sounds: Normal heart sounds. No murmur heard.   Pulmonary:     Effort: Pulmonary effort is normal. No respiratory distress.     Breath sounds: Normal breath sounds. No wheezing.  Abdominal:     General: Bowel sounds are normal. There is no distension.      Palpations: Abdomen is soft.     Tenderness: There is no abdominal tenderness.  Musculoskeletal:        General: No tenderness or edema. Normal range of motion.     Cervical back: Normal range of motion and neck supple.  Skin:    General: Skin is warm and dry.     Findings: No erythema or rash.  Neurological:     Mental Status: He is alert and oriented to person, place, and time.     Cranial Nerves: No cranial nerve deficit.     Deep Tendon Reflexes: Reflexes are normal and symmetric.  Psychiatric:        Mood and Affect: Mood and affect normal.        Behavior: Behavior normal.        Thought Content: Thought content normal.        Judgment: Judgment normal.          BP 130/78   Pulse 92   Temp 98.1 F (36.7 C) (Temporal)   Ht 6' (1.829 m)   Wt 201 lb 6.4 oz (91.4 kg)   SpO2 97%   BMI 27.31 kg/m   Assessment & Plan:  Kyle Gillespie comes in today with chief complaint of Hypertension (Goes up and down )   Diagnosis and  orders addressed:  1. Palpitation - EKG 12-Lead  2. Elevated blood pressure reading   EKG stable  BP stable today Continue to monitor BP at home Report any > 140/90  Jannifer Rodney, FNP

## 2020-09-26 ENCOUNTER — Telehealth: Payer: Self-pay | Admitting: Family

## 2020-09-26 DIAGNOSIS — Z0000 Encounter for general adult medical examination without abnormal findings: Secondary | ICD-10-CM

## 2020-09-27 NOTE — Telephone Encounter (Signed)
Labs ordered.

## 2020-12-09 ENCOUNTER — Telehealth: Payer: Self-pay | Admitting: Oncology

## 2020-12-09 NOTE — Telephone Encounter (Signed)
Scheduled appointment per provider. Patient is aware. 

## 2020-12-31 ENCOUNTER — Other Ambulatory Visit: Payer: Self-pay

## 2020-12-31 ENCOUNTER — Other Ambulatory Visit: Payer: BC Managed Care – PPO

## 2020-12-31 DIAGNOSIS — Z0000 Encounter for general adult medical examination without abnormal findings: Secondary | ICD-10-CM

## 2021-01-01 ENCOUNTER — Other Ambulatory Visit: Payer: Self-pay | Admitting: Family

## 2021-01-01 LAB — CMP14+EGFR
ALT: 27 IU/L (ref 0–44)
AST: 28 IU/L (ref 0–40)
Albumin/Globulin Ratio: 1.8 (ref 1.2–2.2)
Albumin: 4.6 g/dL (ref 3.8–4.8)
Alkaline Phosphatase: 99 IU/L (ref 44–121)
BUN/Creatinine Ratio: 11 (ref 10–24)
BUN: 9 mg/dL (ref 8–27)
Bilirubin Total: 1.1 mg/dL (ref 0.0–1.2)
CO2: 25 mmol/L (ref 20–29)
Calcium: 9.3 mg/dL (ref 8.6–10.2)
Chloride: 103 mmol/L (ref 96–106)
Creatinine, Ser: 0.85 mg/dL (ref 0.76–1.27)
Globulin, Total: 2.5 g/dL (ref 1.5–4.5)
Glucose: 88 mg/dL (ref 65–99)
Potassium: 4.2 mmol/L (ref 3.5–5.2)
Sodium: 141 mmol/L (ref 134–144)
Total Protein: 7.1 g/dL (ref 6.0–8.5)
eGFR: 98 mL/min/{1.73_m2} (ref >59)

## 2021-01-01 LAB — CBC WITH DIFFERENTIAL/PLATELET
Basophils Absolute: 0 10*3/uL (ref 0.0–0.2)
Basos: 0 %
EOS (ABSOLUTE): 0.1 10*3/uL (ref 0.0–0.4)
Eos: 3 %
Hematocrit: 46.4 % (ref 37.5–51.0)
Hemoglobin: 16.3 g/dL (ref 13.0–17.7)
Immature Grans (Abs): 0 10*3/uL (ref 0.0–0.1)
Immature Granulocytes: 0 %
Lymphocytes Absolute: 1.7 10*3/uL (ref 0.7–3.1)
Lymphs: 37 %
MCH: 30.1 pg (ref 26.6–33.0)
MCHC: 35.1 g/dL (ref 31.5–35.7)
MCV: 86 fL (ref 79–97)
Monocytes Absolute: 0.6 10*3/uL (ref 0.1–0.9)
Monocytes: 12 %
Neutrophils Absolute: 2.3 10*3/uL (ref 1.4–7.0)
Neutrophils: 48 %
Platelets: 227 10*3/uL (ref 150–450)
RBC: 5.41 x10E6/uL (ref 4.14–5.80)
RDW: 12.5 % (ref 11.6–15.4)
WBC: 4.7 10*3/uL (ref 3.4–10.8)

## 2021-01-01 LAB — PSA, TOTAL AND FREE
PSA, Free Pct: 26.1 %
PSA, Free: 0.73 ng/mL
Prostate Specific Ag, Serum: 2.8 ng/mL (ref 0.0–4.0)

## 2021-01-01 LAB — LIPID PANEL
Chol/HDL Ratio: 3.3 ratio (ref 0.0–5.0)
Cholesterol, Total: 178 mg/dL (ref 100–199)
HDL: 54 mg/dL (ref >39)
LDL Chol Calc (NIH): 110 mg/dL — ABNORMAL HIGH (ref 0–99)
Triglycerides: 76 mg/dL (ref 0–149)
VLDL Cholesterol Cal: 14 mg/dL (ref 5–40)

## 2021-01-01 LAB — TSH: TSH: 1.84 u[IU]/mL (ref 0.450–4.500)

## 2021-01-01 MED ORDER — ROSUVASTATIN CALCIUM 10 MG PO TABS: 10.0000 mg | ORAL_TABLET | Freq: Every day | ORAL | 3 refills | Status: DC

## 2021-01-07 ENCOUNTER — Telehealth: Payer: Self-pay | Admitting: Oncology

## 2021-01-07 NOTE — Telephone Encounter (Signed)
Patient called to cancel upcoming appointment and stated he would go to his PCP.

## 2021-01-09 ENCOUNTER — Other Ambulatory Visit: Payer: BC Managed Care – PPO

## 2021-01-09 ENCOUNTER — Ambulatory Visit: Payer: BC Managed Care – PPO | Admitting: Oncology

## 2021-01-10 ENCOUNTER — Encounter: Payer: BC Managed Care – PPO | Admitting: Family

## 2021-01-16 ENCOUNTER — Other Ambulatory Visit: Payer: BC Managed Care – PPO

## 2021-01-16 ENCOUNTER — Ambulatory Visit: Payer: BC Managed Care – PPO | Admitting: Oncology

## 2021-01-17 ENCOUNTER — Other Ambulatory Visit: Payer: Self-pay

## 2021-01-17 ENCOUNTER — Encounter: Payer: Self-pay | Admitting: Family

## 2021-01-17 ENCOUNTER — Ambulatory Visit (INDEPENDENT_AMBULATORY_CARE_PROVIDER_SITE_OTHER): Payer: BC Managed Care – PPO | Admitting: Family

## 2021-01-17 VITALS — BP 134/78 | HR 87 | Temp 97.8°F | Ht 72.0 in | Wt 199.4 lb

## 2021-01-17 DIAGNOSIS — Z0001 Encounter for general adult medical examination with abnormal findings: Secondary | ICD-10-CM | POA: Diagnosis not present

## 2021-01-17 DIAGNOSIS — E538 Deficiency of other specified B group vitamins: Secondary | ICD-10-CM

## 2021-01-17 DIAGNOSIS — Z23 Encounter for immunization: Secondary | ICD-10-CM

## 2021-01-17 DIAGNOSIS — E663 Overweight: Secondary | ICD-10-CM

## 2021-01-17 DIAGNOSIS — Z0000 Encounter for general adult medical examination without abnormal findings: Secondary | ICD-10-CM

## 2021-01-17 NOTE — Progress Notes (Signed)
Subjective:    Patient ID: Kyle Gillespie, male    DOB: February 11, 1956, 65 y.o.   MRN: [id number]  Chief Complaint  Patient presents with   Annual Exam   Pt presents to the office today for CPE. Pt denies any headache, palpitations, SOB, or edema at this time. He takes Vit B 12 injection monthly. He has lab work drawn last week. His LDL elevated, but refuses statin today.  Hyperlipidemia This is a chronic problem. The current episode started more than 1 year ago. Exacerbating diseases include obesity. Current antihyperlipidemic treatment includes statins. The current treatment provides moderate improvement of lipids. Risk factors for coronary artery disease include dyslipidemia and male sex.     Review of Systems  All other systems reviewed and are negative.  History reviewed. No pertinent family history. Social History   Socioeconomic History   Marital status: Married    Spouse name: Not on file   Number of children: Not on file   Years of education: Not on file   Highest education level: Not on file  Occupational History   Not on file  Tobacco Use   Smoking status: Never   Smokeless tobacco: Never  Vaping Use   Vaping Use: Never used  Substance and Sexual Activity   Alcohol use: No   Drug use: No   Sexual activity: Not on file  Other Topics Concern   Not on file  Social History Narrative   Not on file   Social Determinants of Health   Financial Resource Strain: Not on file  Food Insecurity: Not on file  Transportation Needs: Not on file  Physical Activity: Not on file  Stress: Not on file  Social Connections: Not on file       Objective:   Physical Exam Vitals reviewed.  Constitutional:      General: He is not in acute distress.    Appearance: He is well-developed.  HENT:     Head: Normocephalic.     Right Ear: Tympanic membrane normal.     Left Ear: Tympanic membrane normal.  Eyes:     General:        Right eye: No discharge.        Left eye: No  discharge.     Pupils: Pupils are equal, round, and reactive to light.  Neck:     Thyroid: No thyromegaly.  Cardiovascular:     Rate and Rhythm: Normal rate and regular rhythm.     Heart sounds: Normal heart sounds. No murmur heard. Pulmonary:     Effort: Pulmonary effort is normal. No respiratory distress.     Breath sounds: Normal breath sounds. No wheezing.  Abdominal:     General: Bowel sounds are normal. There is no distension.     Palpations: Abdomen is soft.     Tenderness: There is no abdominal tenderness.  Musculoskeletal:        General: No tenderness. Normal range of motion.     Cervical back: Normal range of motion and neck supple.  Skin:    General: Skin is warm and dry.     Findings: No erythema or rash.  Neurological:     Mental Status: He is alert and oriented to person, place, and time.     Cranial Nerves: No cranial nerve deficit.     Deep Tendon Reflexes: Reflexes are normal and symmetric.  Psychiatric:        Behavior: Behavior normal.  Thought Content: Thought content normal.        Judgment: Judgment normal.      BP 134/78   Pulse 87   Temp 97.8 F (36.6 C) (Temporal)   Ht 6' (1.829 m)   Wt 199 lb 6.4 oz (90.4 kg)   BMI 27.04 kg/m      Assessment & Plan:  Kyle Gillespie comes in today with chief complaint of Annual Exam   Diagnosis and orders addressed:  1. Annual physical exam   2. Vitamin B 12 deficiency - Vitamin B12  3. Overweight (BMI 25.0-29.9)  4. Need for shingles vaccine   Labs pending Health Maintenance reviewed Diet and exercise encouraged  Follow up plan: 1 year    Jannifer Rodney, FNP

## 2021-01-17 NOTE — Patient Instructions (Signed)
Health Maintenance, Male Adopting a healthy lifestyle and getting preventive care are important in promoting health and wellness. Ask your health care provider about: The right schedule for you to have regular tests and exams. Things you can do on your own to prevent diseases and keep yourself healthy. What should I know about diet, weight, and exercise? Eat a healthy diet  Eat a diet that includes plenty of vegetables, fruits, low-fat dairy products, and lean protein. Do not eat a lot of foods that are high in solid fats, added sugars, or sodium. Maintain a healthy weight Body mass index (BMI) is a measurement that can be used to identify possible weight problems. It estimates body fat based on height and weight. Your health care provider can help determine your BMI and help you achieve or maintain a healthy weight. Get regular exercise Get regular exercise. This is one of the most important things you can do for your health. Most adults should: Exercise for at least 150 minutes each week. The exercise should increase your heart rate and make you sweat (moderate-intensity exercise). Do strengthening exercises at least twice a week. This is in addition to the moderate-intensity exercise. Spend less time sitting. Even light physical activity can be beneficial. Watch cholesterol and blood lipids Have your blood tested for lipids and cholesterol at 63 years of age, then have this test every 5 years. You may need to have your cholesterol levels checked more often if: Your lipid or cholesterol levels are high. You are older than 63 years of age. You are at high risk for heart disease. What should I know about cancer screening? Many types of cancers can be detected early and may often be prevented. Depending on your health history and family history, you may need to have cancer screening at various ages. This may include screening for: Colorectal cancer. Prostate cancer. Skin cancer. Lung  cancer. What should I know about heart disease, diabetes, and high blood pressure? Blood pressure and heart disease High blood pressure causes heart disease and increases the risk of stroke. This is more likely to develop in people who have high blood pressure readings, are of African descent, or are overweight. Talk with your health care provider about your target blood pressure readings. Have your blood pressure checked: Every 3-5 years if you are 18-39 years of age. Every year if you are 40 years old or older. If you are between the ages of 65 and 75 and are a current or former smoker, ask your health care provider if you should have a one-time screening for abdominal aortic aneurysm (AAA). Diabetes Have regular diabetes screenings. This checks your fasting blood sugar level. Have the screening done: Once every three years after age 45 if you are at a normal weight and have a low risk for diabetes. More often and at a younger age if you are overweight or have a high risk for diabetes. What should I know about preventing infection? Hepatitis B If you have a higher risk for hepatitis B, you should be screened for this virus. Talk with your health care provider to find out if you are at risk for hepatitis B infection. Hepatitis C Blood testing is recommended for: Everyone born from 1945 through 1965. Anyone with known risk factors for hepatitis C. Sexually transmitted infections (STIs) You should be screened each year for STIs, including gonorrhea and chlamydia, if: You are sexually active and are younger than 63 years of age. You are older than 63 years   of age and your health care provider tells you that you are at risk for this type of infection. Your sexual activity has changed since you were last screened, and you are at increased risk for chlamydia or gonorrhea. Ask your health care provider if you are at risk. Ask your health care provider about whether you are at high risk for HIV.  Your health care provider may recommend a prescription medicine to help prevent HIV infection. If you choose to take medicine to prevent HIV, you should first get tested for HIV. You should then be tested every 3 months for as long as you are taking the medicine. Follow these instructions at home: Lifestyle Do not use any products that contain nicotine or tobacco, such as cigarettes, e-cigarettes, and chewing tobacco. If you need help quitting, ask your health care provider. Do not use street drugs. Do not share needles. Ask your health care provider for help if you need support or information about quitting drugs. Alcohol use Do not drink alcohol if your health care provider tells you not to drink. If you drink alcohol: Limit how much you have to 0-2 drinks a day. Be aware of how much alcohol is in your drink. In the [place] one drink equals one 12 oz bottle of beer (355 mL), one 5 oz glass of wine (148 mL), or one 1 oz glass of hard liquor (44 mL). General instructions Schedule regular health, dental, and eye exams. Stay current with your vaccines. Tell your health care provider if: You often feel depressed. You have ever been abused or do not feel safe at home. Summary Adopting a healthy lifestyle and getting preventive care are important in promoting health and wellness. Follow your health care provider's instructions about healthy diet, exercising, and getting tested or screened for diseases. Follow your health care provider's instructions on monitoring your cholesterol and blood pressure. This information is not intended to replace advice given to you by your health care provider. Make sure you discuss any questions you have with your health care provider. Document Revised: 07/05/2020 Document Reviewed: 04/20/2018 Elsevier Patient Education  2022 [employer] Inc.  

## 2021-01-18 LAB — VITAMIN B12: Vitamin B-12: 437 pg/mL (ref 232–1245)

## 2021-03-13 ENCOUNTER — Other Ambulatory Visit: Payer: Self-pay | Admitting: Oncology

## 2021-03-14 ENCOUNTER — Telehealth: Payer: Self-pay | Admitting: Oncology

## 2021-03-14 NOTE — Telephone Encounter (Signed)
Attempted to sch per 11/3 inbasket, pt said he has found a local dr and is not in need of coming to see [name], informed desk nurse via staff msg.

## 2021-03-17 MED ORDER — CYANOCOBALAMIN 1000 MCG/ML IJ SOLN: 1000.0000 ug | INTRAMUSCULAR | 3 refills | Status: DC

## 2021-04-17 ENCOUNTER — Ambulatory Visit: Payer: BC Managed Care – PPO

## 2021-04-17 ENCOUNTER — Ambulatory Visit (INDEPENDENT_AMBULATORY_CARE_PROVIDER_SITE_OTHER): Payer: BC Managed Care – PPO

## 2021-04-17 ENCOUNTER — Other Ambulatory Visit: Payer: Self-pay

## 2021-04-17 DIAGNOSIS — Z23 Encounter for immunization: Secondary | ICD-10-CM

## 2022-01-09 ENCOUNTER — Other Ambulatory Visit: Payer: Self-pay | Admitting: Family

## 2022-01-09 DIAGNOSIS — Z0000 Encounter for general adult medical examination without abnormal findings: Secondary | ICD-10-CM

## 2022-01-13 ENCOUNTER — Other Ambulatory Visit: Payer: BC Managed Care – PPO

## 2022-01-13 DIAGNOSIS — Z0000 Encounter for general adult medical examination without abnormal findings: Secondary | ICD-10-CM | POA: Diagnosis not present

## 2022-01-14 LAB — ANEMIA PROFILE B
Basophils Absolute: 0 10*3/uL (ref 0.0–0.2)
Basos: 0 %
EOS (ABSOLUTE): 0.1 10*3/uL (ref 0.0–0.4)
Eos: 3 %
Ferritin: 151 ng/mL (ref 30–400)
Folate: 11.9 ng/mL (ref >3.0)
Hematocrit: 48.2 % (ref 37.5–51.0)
Hemoglobin: 16.9 g/dL (ref 13.0–17.7)
Immature Grans (Abs): 0 10*3/uL (ref 0.0–0.1)
Immature Granulocytes: 0 %
Iron Saturation: 37 % (ref 15–55)
Iron: 128 ug/dL (ref 38–169)
Lymphocytes Absolute: 1.6 10*3/uL (ref 0.7–3.1)
Lymphs: 33 %
MCH: 30.5 pg (ref 26.6–33.0)
MCHC: 35.1 g/dL (ref 31.5–35.7)
MCV: 87 fL (ref 79–97)
Monocytes Absolute: 0.6 10*3/uL (ref 0.1–0.9)
Monocytes: 13 %
Neutrophils Absolute: 2.4 10*3/uL (ref 1.4–7.0)
Neutrophils: 51 %
Platelets: 223 10*3/uL (ref 150–450)
RBC: 5.55 x10E6/uL (ref 4.14–5.80)
RDW: 13.1 % (ref 11.6–15.4)
Retic Ct Pct: 1.4 % (ref 0.6–2.6)
Total Iron Binding Capacity: 344 ug/dL (ref 250–450)
UIBC: 216 ug/dL (ref 111–343)
Vitamin B-12: 295 pg/mL (ref 232–1245)
WBC: 4.7 10*3/uL (ref 3.4–10.8)

## 2022-01-14 LAB — PSA, TOTAL AND FREE
PSA, Free Pct: 35.5 %
PSA, Free: 0.71 ng/mL
Prostate Specific Ag, Serum: 2 ng/mL (ref 0.0–4.0)

## 2022-01-14 LAB — LIPID PANEL
Chol/HDL Ratio: 3.6 ratio (ref 0.0–5.0)
Cholesterol, Total: 175 mg/dL (ref 100–199)
HDL: 48 mg/dL (ref >39)
LDL Chol Calc (NIH): 110 mg/dL — ABNORMAL HIGH (ref 0–99)
Triglycerides: 90 mg/dL (ref 0–149)
VLDL Cholesterol Cal: 17 mg/dL (ref 5–40)

## 2022-01-14 LAB — CMP14+EGFR
ALT: 29 IU/L (ref 0–44)
AST: 27 IU/L (ref 0–40)
Albumin/Globulin Ratio: 1.8 (ref 1.2–2.2)
Albumin: 4.5 g/dL (ref 3.9–4.9)
Alkaline Phosphatase: 88 IU/L (ref 44–121)
BUN/Creatinine Ratio: 11 (ref 10–24)
BUN: 11 mg/dL (ref 8–27)
Bilirubin Total: 1.1 mg/dL (ref 0.0–1.2)
CO2: 23 mmol/L (ref 20–29)
Calcium: 9.7 mg/dL (ref 8.6–10.2)
Chloride: 106 mmol/L (ref 96–106)
Creatinine, Ser: 1 mg/dL (ref 0.76–1.27)
Globulin, Total: 2.5 g/dL (ref 1.5–4.5)
Glucose: 99 mg/dL (ref 70–99)
Potassium: 4.5 mmol/L (ref 3.5–5.2)
Sodium: 143 mmol/L (ref 134–144)
Total Protein: 7 g/dL (ref 6.0–8.5)
eGFR: 84 mL/min/{1.73_m2} (ref >59)

## 2022-01-14 LAB — TSH: TSH: 2.24 u[IU]/mL (ref 0.450–4.500)

## 2022-01-15 ENCOUNTER — Other Ambulatory Visit: Payer: Self-pay | Admitting: Family

## 2022-01-15 MED ORDER — ROSUVASTATIN CALCIUM 5 MG PO TABS: 5.0000 mg | ORAL_TABLET | Freq: Every day | ORAL | 3 refills | Status: DC

## 2022-01-19 ENCOUNTER — Ambulatory Visit (INDEPENDENT_AMBULATORY_CARE_PROVIDER_SITE_OTHER): Payer: BC Managed Care – PPO | Admitting: Family

## 2022-01-19 ENCOUNTER — Encounter: Payer: Self-pay | Admitting: Family

## 2022-01-19 VITALS — BP 140/85 | HR 71 | Temp 96.1°F | Resp 20 | Ht 72.0 in | Wt 202.0 lb

## 2022-01-19 DIAGNOSIS — E538 Deficiency of other specified B group vitamins: Secondary | ICD-10-CM

## 2022-01-19 DIAGNOSIS — E663 Overweight: Secondary | ICD-10-CM | POA: Diagnosis not present

## 2022-01-19 DIAGNOSIS — Z0001 Encounter for general adult medical examination with abnormal findings: Secondary | ICD-10-CM | POA: Diagnosis not present

## 2022-01-19 DIAGNOSIS — Z0000 Encounter for general adult medical examination without abnormal findings: Secondary | ICD-10-CM

## 2022-01-19 NOTE — Progress Notes (Signed)
   Subjective:    Patient ID: Kyle Gillespie, male    DOB: 18-Oct-1955, 66 y.o.   MRN: [id number]  Chief Complaint  Patient presents with   Annual Exam   Pt presents to the office today for CPE. Pt denies any headache, palpitations, SOB, or edema at this time.   He takes Vit B 12 injection monthly. He has lab work drawn last week. His LDL elevated, but refuses statin today. Hyperlipidemia This is a chronic problem. The current episode started more than 1 year ago. The problem is uncontrolled. Current antihyperlipidemic treatment includes statins and diet change. The current treatment provides mild improvement of lipids. Risk factors for coronary artery disease include dyslipidemia and male sex.      Review of Systems  All other systems reviewed and are negative.      Objective:   Physical Exam Vitals reviewed.  Constitutional:      General: He is not in acute distress.    Appearance: He is well-developed.  HENT:     Head: Normocephalic.     Right Ear: Tympanic membrane normal.     Left Ear: Tympanic membrane normal.  Eyes:     General:        Right eye: No discharge.        Left eye: No discharge.     Pupils: Pupils are equal, round, and reactive to light.  Neck:     Thyroid: No thyromegaly.  Cardiovascular:     Rate and Rhythm: Normal rate and regular rhythm.     Heart sounds: Murmur heard.  Pulmonary:     Effort: Pulmonary effort is normal. No respiratory distress.     Breath sounds: Normal breath sounds. No wheezing.  Abdominal:     General: Bowel sounds are normal. There is no distension.     Palpations: Abdomen is soft.     Tenderness: There is no abdominal tenderness.  Musculoskeletal:        General: No tenderness. Normal range of motion.     Cervical back: Normal range of motion and neck supple.  Skin:    General: Skin is warm and dry.     Findings: No erythema or rash.  Neurological:     Mental Status: He is alert and oriented to person, place, and time.      Cranial Nerves: No cranial nerve deficit.     Deep Tendon Reflexes: Reflexes are normal and symmetric.  Psychiatric:        Behavior: Behavior normal.        Thought Content: Thought content normal.        Judgment: Judgment normal.       BP (!) 140/85   Pulse 71   Temp (!) 96.1 F (35.6 C) (Oral)   Resp 20   Ht 6' (1.829 m)   Wt 202 lb (91.6 kg)   SpO2 97%   BMI 27.40 kg/m      Assessment & Plan:  Kyle Gillespie comes in today with chief complaint of Annual Exam   Diagnosis and orders addressed:  1. Annual physical exam  2. Vitamin B 12 deficiency  3. Overweight (BMI 25.0-29.9)   Labs reviewed  Health Maintenance reviewed Diet and exercise encouraged  Follow up plan: 1 year    Jannifer Rodney, FNP

## 2022-01-19 NOTE — Patient Instructions (Signed)
Health Maintenance, Male Adopting a healthy lifestyle and getting preventive care are important in promoting health and wellness. Ask your health care provider about: The right schedule for you to have regular tests and exams. Things you can do on your own to prevent diseases and keep yourself healthy. What should I know about diet, weight, and exercise? Eat a healthy diet  Eat a diet that includes plenty of vegetables, fruits, low-fat dairy products, and lean protein. Do not eat a lot of foods that are high in solid fats, added sugars, or sodium. Maintain a healthy weight Body mass index (BMI) is a measurement that can be used to identify possible weight problems. It estimates body fat based on height and weight. Your health care provider can help determine your BMI and help you achieve or maintain a healthy weight. Get regular exercise Get regular exercise. This is one of the most important things you can do for your health. Most adults should: Exercise for at least 150 minutes each week. The exercise should increase your heart rate and make you sweat (moderate-intensity exercise). Do strengthening exercises at least twice a week. This is in addition to the moderate-intensity exercise. Spend less time sitting. Even light physical activity can be beneficial. Watch cholesterol and blood lipids Have your blood tested for lipids and cholesterol at 64 years of age, then have this test every 5 years. You may need to have your cholesterol levels checked more often if: Your lipid or cholesterol levels are high. You are older than 64 years of age. You are at high risk for heart disease. What should I know about cancer screening? Many types of cancers can be detected early and may often be prevented. Depending on your health history and family history, you may need to have cancer screening at various ages. This may include screening for: Colorectal cancer. Prostate cancer. Skin cancer. Lung  cancer. What should I know about heart disease, diabetes, and high blood pressure? Blood pressure and heart disease High blood pressure causes heart disease and increases the risk of stroke. This is more likely to develop in people who have high blood pressure readings or are overweight. Talk with your health care provider about your target blood pressure readings. Have your blood pressure checked: Every 3-5 years if you are 18-39 years of age. Every year if you are 40 years old or older. If you are between the ages of 65 and 75 and are a current or former smoker, ask your health care provider if you should have a one-time screening for abdominal aortic aneurysm (AAA). Diabetes Have regular diabetes screenings. This checks your fasting blood sugar level. Have the screening done: Once every three years after age 45 if you are at a normal weight and have a low risk for diabetes. More often and at a younger age if you are overweight or have a high risk for diabetes. What should I know about preventing infection? Hepatitis B If you have a higher risk for hepatitis B, you should be screened for this virus. Talk with your health care provider to find out if you are at risk for hepatitis B infection. Hepatitis C Blood testing is recommended for: Everyone born from 1945 through 1965. Anyone with known risk factors for hepatitis C. Sexually transmitted infections (STIs) You should be screened each year for STIs, including gonorrhea and chlamydia, if: You are sexually active and are younger than 64 years of age. You are older than 64 years of age and your   health care provider tells you that you are at risk for this type of infection. Your sexual activity has changed since you were last screened, and you are at increased risk for chlamydia or gonorrhea. Ask your health care provider if you are at risk. Ask your health care provider about whether you are at high risk for HIV. Your health care provider  may recommend a prescription medicine to help prevent HIV infection. If you choose to take medicine to prevent HIV, you should first get tested for HIV. You should then be tested every 3 months for as long as you are taking the medicine. Follow these instructions at home: Alcohol use Do not drink alcohol if your health care provider tells you not to drink. If you drink alcohol: Limit how much you have to 0-2 drinks a day. Know how much alcohol is in your drink. In the [place] one drink equals one 12 oz bottle of beer (355 mL), one 5 oz glass of wine (148 mL), or one 1 oz glass of hard liquor (44 mL). Lifestyle Do not use any products that contain nicotine or tobacco. These products include cigarettes, chewing tobacco, and vaping devices, such as e-cigarettes. If you need help quitting, ask your health care provider. Do not use street drugs. Do not share needles. Ask your health care provider for help if you need support or information about quitting drugs. General instructions Schedule regular health, dental, and eye exams. Stay current with your vaccines. Tell your health care provider if: You often feel depressed. You have ever been abused or do not feel safe at home. Summary Adopting a healthy lifestyle and getting preventive care are important in promoting health and wellness. Follow your health care provider's instructions about healthy diet, exercising, and getting tested or screened for diseases. Follow your health care provider's instructions on monitoring your cholesterol and blood pressure. This information is not intended to replace advice given to you by your health care provider. Make sure you discuss any questions you have with your health care provider. Document Revised: 09/16/2020 Document Reviewed: 09/16/2020 Elsevier Patient Education  2023 Elsevier Inc.  

## 2022-03-19 DIAGNOSIS — H1013 Acute atopic conjunctivitis, bilateral: Secondary | ICD-10-CM | POA: Diagnosis not present

## 2022-05-05 ENCOUNTER — Other Ambulatory Visit: Payer: Self-pay | Admitting: Family

## 2022-05-20 DIAGNOSIS — H40033 Anatomical narrow angle, bilateral: Secondary | ICD-10-CM | POA: Diagnosis not present

## 2022-05-20 DIAGNOSIS — H04123 Dry eye syndrome of bilateral lacrimal glands: Secondary | ICD-10-CM | POA: Diagnosis not present

## 2022-08-01 ENCOUNTER — Other Ambulatory Visit: Payer: Self-pay | Admitting: Family

## 2022-09-25 DIAGNOSIS — H04123 Dry eye syndrome of bilateral lacrimal glands: Secondary | ICD-10-CM | POA: Diagnosis not present

## 2022-09-25 DIAGNOSIS — H40033 Anatomical narrow angle, bilateral: Secondary | ICD-10-CM | POA: Diagnosis not present

## 2022-11-05 ENCOUNTER — Telehealth: Payer: Self-pay | Admitting: Family

## 2022-11-05 NOTE — Telephone Encounter (Signed)
Pt called to schedule his CPE and requested to switch providers. Pt would like Dr Darlyn Read to be his PCP. Is [facility]y and [facility] ok with this?

## 2022-11-05 NOTE — Telephone Encounter (Signed)
Works for me 

## 2022-11-09 NOTE — Telephone Encounter (Signed)
This is fine.   Phelicia Dantes, FNP  

## 2022-11-09 NOTE — Telephone Encounter (Signed)
Appt aware appt made

## 2023-01-11 ENCOUNTER — Encounter: Payer: Self-pay | Admitting: Family Medicine

## 2023-01-12 ENCOUNTER — Other Ambulatory Visit: Payer: Self-pay | Admitting: Family Medicine

## 2023-01-12 DIAGNOSIS — E559 Vitamin D deficiency, unspecified: Secondary | ICD-10-CM

## 2023-01-12 DIAGNOSIS — E782 Mixed hyperlipidemia: Secondary | ICD-10-CM

## 2023-01-12 DIAGNOSIS — E538 Deficiency of other specified B group vitamins: Secondary | ICD-10-CM

## 2023-01-13 ENCOUNTER — Other Ambulatory Visit: Payer: Self-pay | Admitting: *Deleted

## 2023-01-13 ENCOUNTER — Other Ambulatory Visit: Payer: Medicare PPO

## 2023-01-13 DIAGNOSIS — E538 Deficiency of other specified B group vitamins: Secondary | ICD-10-CM

## 2023-01-13 DIAGNOSIS — Z0000 Encounter for general adult medical examination without abnormal findings: Secondary | ICD-10-CM

## 2023-01-14 ENCOUNTER — Other Ambulatory Visit: Payer: Medicare PPO

## 2023-01-14 DIAGNOSIS — Z0000 Encounter for general adult medical examination without abnormal findings: Secondary | ICD-10-CM

## 2023-01-14 LAB — CBC WITH DIFFERENTIAL/PLATELET
Basophils Absolute: 0 10*3/uL (ref 0.0–0.2)
Basos: 0 %
EOS (ABSOLUTE): 0.1 10*3/uL (ref 0.0–0.4)
Eos: 2 %
Hematocrit: 47.2 % (ref 37.5–51.0)
Hemoglobin: 15.8 g/dL (ref 13.0–17.7)
Immature Grans (Abs): 0 10*3/uL (ref 0.0–0.1)
Immature Granulocytes: 0 %
Lymphocytes Absolute: 1.4 10*3/uL (ref 0.7–3.1)
Lymphs: 36 %
MCH: 30.3 pg (ref 26.6–33.0)
MCHC: 33.5 g/dL (ref 31.5–35.7)
MCV: 91 fL (ref 79–97)
Monocytes Absolute: 0.5 10*3/uL (ref 0.1–0.9)
Monocytes: 13 %
Neutrophils Absolute: 1.9 10*3/uL (ref 1.4–7.0)
Neutrophils: 49 %
Platelets: 202 10*3/uL (ref 150–450)
RBC: 5.21 x10E6/uL (ref 4.14–5.80)
RDW: 12.8 % (ref 11.6–15.4)
WBC: 3.9 10*3/uL (ref 3.4–10.8)

## 2023-01-14 LAB — URINALYSIS
Bilirubin, UA: NEGATIVE
Glucose, UA: NEGATIVE
Ketones, UA: NEGATIVE
Leukocytes,UA: NEGATIVE
Nitrite, UA: NEGATIVE
Protein,UA: NEGATIVE
RBC, UA: NEGATIVE
Specific Gravity, UA: 1.01 (ref 1.005–1.030)
Urobilinogen, Ur: 0.2 mg/dL (ref 0.2–1.0)
pH, UA: 7 (ref 5.0–7.5)

## 2023-01-14 LAB — CMP14+EGFR
ALT: 20 IU/L (ref 0–44)
AST: 22 IU/L (ref 0–40)
Albumin: 4.3 g/dL (ref 3.9–4.9)
Alkaline Phosphatase: 84 IU/L (ref 44–121)
BUN/Creatinine Ratio: 10 (ref 10–24)
BUN: 9 mg/dL (ref 8–27)
Bilirubin Total: 1.3 mg/dL — ABNORMAL HIGH (ref 0.0–1.2)
CO2: 25 mmol/L (ref 20–29)
Calcium: 9.5 mg/dL (ref 8.6–10.2)
Chloride: 105 mmol/L (ref 96–106)
Creatinine, Ser: 0.91 mg/dL (ref 0.76–1.27)
Globulin, Total: 2.5 g/dL (ref 1.5–4.5)
Glucose: 89 mg/dL (ref 70–99)
Potassium: 4.6 mmol/L (ref 3.5–5.2)
Sodium: 142 mmol/L (ref 134–144)
Total Protein: 6.8 g/dL (ref 6.0–8.5)
eGFR: 94 mL/min/{1.73_m2} (ref >59)

## 2023-01-14 LAB — LIPID PANEL
Chol/HDL Ratio: 3.5 ratio (ref 0.0–5.0)
Cholesterol, Total: 164 mg/dL (ref 100–199)
HDL: 47 mg/dL (ref >39)
LDL Chol Calc (NIH): 104 mg/dL — ABNORMAL HIGH (ref 0–99)
Triglycerides: 64 mg/dL (ref 0–149)
VLDL Cholesterol Cal: 13 mg/dL (ref 5–40)

## 2023-01-14 LAB — FOLATE: Folate: 13.3 ng/mL (ref >3.0)

## 2023-01-14 LAB — TSH: TSH: 2.11 u[IU]/mL (ref 0.450–4.500)

## 2023-01-14 LAB — VITAMIN B12: Vitamin B-12: 344 pg/mL (ref 232–1245)

## 2023-01-21 ENCOUNTER — Encounter: Payer: Self-pay | Admitting: Family Medicine

## 2023-01-21 ENCOUNTER — Ambulatory Visit (INDEPENDENT_AMBULATORY_CARE_PROVIDER_SITE_OTHER): Payer: Medicare PPO | Admitting: Family Medicine

## 2023-01-21 VITALS — Ht 72.0 in | Wt 201.2 lb

## 2023-01-21 DIAGNOSIS — Z23 Encounter for immunization: Secondary | ICD-10-CM

## 2023-01-21 DIAGNOSIS — E538 Deficiency of other specified B group vitamins: Secondary | ICD-10-CM | POA: Diagnosis not present

## 2023-01-21 DIAGNOSIS — Z0000 Encounter for general adult medical examination without abnormal findings: Secondary | ICD-10-CM

## 2023-01-21 DIAGNOSIS — R110 Nausea: Secondary | ICD-10-CM | POA: Diagnosis not present

## 2023-01-21 DIAGNOSIS — Z0001 Encounter for general adult medical examination with abnormal findings: Secondary | ICD-10-CM | POA: Diagnosis not present

## 2023-01-21 MED ORDER — CYANOCOBALAMIN 1000 MCG/ML IJ SOLN: 1000.0000 ug | INTRAMUSCULAR | 3 refills | Status: DC

## 2023-01-21 NOTE — Progress Notes (Signed)
Subjective:  Patient ID: Kyle Gillespie, male    DOB: 05/08/1956  Age: 67 y.o. MRN: [id number]  CC: Annual Exam   HPI Kyle Gillespie presents for CPE. 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  Pt. Declines statin due to hx of elevated liver enzymes.     01/21/2023   11:00 AM 01/19/2022   12:10 PM 06/28/2020    4:02 PM  Depression screen PHQ 2/9  Decreased Interest 0 0 0  Down, Depressed, Hopeless 0 0 0  PHQ - 2 Score 0 0 0    History Kyle Gillespie has a past medical history of DDD (degenerative disc disease), Dry eyes, Macular degeneration, and Vitamin B 12 deficiency.   He has a past surgical history that includes Appendectomy.   His family history is not on file.He reports that he has never smoked. He has never used smokeless tobacco. He reports that he does not drink alcohol and does not use drugs.    ROS Review of Systems  Constitutional:  Negative for activity change, fatigue and unexpected weight change.  HENT:  Negative for congestion, ear pain, hearing loss, postnasal drip and trouble swallowing.   Eyes:  Negative for pain and visual disturbance.  Respiratory:  Negative for cough, chest tightness and shortness of breath.   Cardiovascular:  Negative for chest pain, palpitations and leg swelling.  Gastrointestinal:  Positive for nausea (queezy intermittently). Negative for abdominal distention, abdominal pain, blood in stool, constipation, diarrhea and vomiting.  Endocrine: Negative for cold intolerance, heat intolerance and polydipsia.  Genitourinary:  Negative for difficulty urinating, dysuria, flank pain, frequency and urgency.  Musculoskeletal:  Negative for arthralgias and joint swelling.  Skin:  Negative for color change, rash and wound.  Neurological:  Positive for tremors (with B 12 deficiency) and syncope (1 month ago). Negative for dizziness, speech difficulty, weakness, light-headedness, numbness and headaches.  Hematological:  Does not  bruise/bleed easily.  Psychiatric/Behavioral:  Negative for confusion, decreased concentration, dysphoric mood and sleep disturbance. The patient is not nervous/anxious.     Objective:  Ht 6' (1.829 m)   Wt 201 lb 3.2 oz (91.3 kg)   BMI 27.29 kg/m   BP Readings from Last 3 Encounters:  01/19/22 (!) 140/85  01/17/21 134/78  06/28/20 130/78    Wt Readings from Last 3 Encounters:  01/21/23 201 lb 3.2 oz (91.3 kg)  01/19/22 202 lb (91.6 kg)  01/17/21 199 lb 6.4 oz (90.4 kg)     Physical Exam Constitutional:      Appearance: He is well-developed.  HENT:     Head: Normocephalic and atraumatic.  Eyes:     Pupils: Pupils are equal, round, and reactive to light.  Neck:     Thyroid: No thyromegaly.     Trachea: No tracheal deviation.  Cardiovascular:     Rate and Rhythm: Normal rate and regular rhythm.     Heart sounds: Murmur (2/6 late systolic) heard.     No friction rub. No gallop.  Pulmonary:     Breath sounds: Normal breath sounds. No wheezing or rales.  Abdominal:     General: Bowel sounds are normal. There is no distension.     Palpations: Abdomen is soft. There is no mass.     Tenderness: There is no abdominal tenderness.     Hernia: There is no hernia in the left inguinal area.  Genitourinary:    Penis: Normal.      Testes:  Left: Mass (left side sperm granuloma) present.  Musculoskeletal:        General: Normal range of motion.     Cervical back: Normal range of motion.  Lymphadenopathy:     Cervical: No cervical adenopathy.  Skin:    General: Skin is warm and dry.  Neurological:     Mental Status: He is alert and oriented to person, place, and time.       Assessment & Plan:   Kyle Gillespie was seen today for annual exam.  Diagnoses and all orders for this visit:  Well adult exam  Vitamin B 12 deficiency  Encounter for immunization -     Flu Vaccine Trivalent High Dose (Fluad) -     Pneumococcal conjugate vaccine 20-valent  Nausea  Other  orders -     cyanocobalamin (VITAMIN B12) 1000 MCG/ML injection; Inject 1 mL (1,000 mcg total) into the skin every 14 (fourteen) days.       I have discontinued [name]. [name]'s rosuvastatin and cyanocobalamin. I am also having him start on cyanocobalamin. Additionally, I am having him maintain his ibuprofen, Multiple Vitamins-Minerals (ICAPS MV PO), and fluticasone.  Allergies as of 01/21/2023       Reactions   Penicillins Rash   Morphine And Codeine Other (See Comments)   Severe headache   Sulfa Antibiotics Other (See Comments)   Patient states mother had reaction while she was pregnant and patient was instructed not to take.         Medication List        Accurate as of January 21, 2023 11:59 PM. If you have any questions, ask your nurse or doctor.          STOP taking these medications    rosuvastatin 5 MG tablet Commonly known as: Crestor Stopped by: Chasyn Cinque       TAKE these medications    cyanocobalamin 1000 MCG/ML injection Commonly known as: VITAMIN B12 Inject 1 mL (1,000 mcg total) into the skin every 14 (fourteen) days. What changed: See the new instructions. Changed by: Teryn Gust   fluticasone 50 MCG/ACT nasal spray Commonly known as: FLONASE Place 2 sprays into both nostrils daily.   ibuprofen 200 MG tablet Commonly known as: ADVIL Take 200 mg by mouth every 6 (six) hours as needed.   ICAPS MV PO Take by mouth.         Follow-up: Return in about 1 year (around 01/21/2024).  Mechele Claude, M.D.

## 2023-01-25 ENCOUNTER — Encounter: Payer: Self-pay | Admitting: Family Medicine

## 2023-01-26 ENCOUNTER — Other Ambulatory Visit: Payer: Self-pay | Admitting: Family

## 2023-04-14 ENCOUNTER — Ambulatory Visit (INDEPENDENT_AMBULATORY_CARE_PROVIDER_SITE_OTHER): Payer: Medicare PPO | Admitting: Family Medicine

## 2023-04-14 ENCOUNTER — Encounter: Payer: Self-pay | Admitting: Family Medicine

## 2023-04-14 VITALS — BP 139/79 | HR 76 | Temp 97.2°F | Ht 72.0 in | Wt 204.0 lb

## 2023-04-14 DIAGNOSIS — Z0000 Encounter for general adult medical examination without abnormal findings: Secondary | ICD-10-CM

## 2023-04-14 DIAGNOSIS — Z125 Encounter for screening for malignant neoplasm of prostate: Secondary | ICD-10-CM | POA: Diagnosis not present

## 2023-04-14 LAB — URINALYSIS
Bilirubin, UA: NEGATIVE
Glucose, UA: NEGATIVE
Ketones, UA: NEGATIVE
Leukocytes,UA: NEGATIVE
Nitrite, UA: NEGATIVE
Protein,UA: NEGATIVE
RBC, UA: NEGATIVE
Specific Gravity, UA: <1.005 — ABNORMAL LOW (ref 1.005–1.030)
Urobilinogen, Ur: 0.2 mg/dL (ref 0.2–1.0)
pH, UA: 6 (ref 5.0–7.5)

## 2023-04-14 NOTE — Progress Notes (Signed)
Annual Wellness Visit     Patient: Kyle Gillespie, Male    DOB: Nov 11, 1955, 67 y.o.   MRN: [id number]  Subjective  Chief Complaint  Patient presents with   WELCOME TO MEDICARE    SAMMY TANNEN is a 67 y.o. male who presents today for his Annual Wellness Visit. He reports consuming a low sodium and low carb  diet. The patient does not participate in regular exercise at present. He generally feels well. He reports sleeping well. He does have additional problems to discuss today. BENDING AND TWISTING FLARES BAXK PAIN. Bulginf disc 30 years ago.   HPI  Dental: No current dental problems and Last dental visit: over 1 year ago. Planning visit soon. Seeing optometry annually.  and PSA: Agrees to PSA testing   Patient Active Problem List   Diagnosis Date Noted   Overweight (BMI 25.0-29.9) 06/04/2020   Vitamin B 12 deficiency 11/23/2011   DDD (degenerative disc disease), lumbosacral    Past Medical History:  Diagnosis Date   DDD (degenerative disc disease)    Dry eyes    Macular degeneration    stage 1   Vitamin B 12 deficiency    Past Surgical History:  Procedure Laterality Date   APPENDECTOMY     Social History   Tobacco Use   Smoking status: Never   Smokeless tobacco: Never  Vaping Use   Vaping status: Never Used  Substance Use Topics   Alcohol use: No   Drug use: No   Family History  Problem Relation Age of Onset   Heart disease Mother    Stroke Father    Hepatitis C Brother    Alcoholism Brother    Allergies  Allergen Reactions   Penicillins Rash   Morphine And Codeine Other (See Comments)    Severe headache   Sulfa Antibiotics Other (See Comments)    Patient states mother had reaction while she was pregnant and patient was instructed not to take.       Medications: Outpatient Medications Prior to Visit  Medication Sig   acetaminophen (TYLENOL) 325 MG tablet Take 650 mg by mouth every 6 (six) hours as needed for mild pain (pain score 1-3).    cyanocobalamin (VITAMIN B12) 1000 MCG/ML injection Inject 1 mL (1,000 mcg total) into the skin every 14 (fourteen) days.   fluticasone (FLONASE) 50 MCG/ACT nasal spray Place 2 sprays into both nostrils daily.   ibuprofen (ADVIL,MOTRIN) 200 MG tablet Take 200 mg by mouth every 6 (six) hours as needed.   Multiple Vitamins-Minerals (ICAPS MV PO) Take by mouth.   No facility-administered medications prior to visit.    Allergies  Allergen Reactions   Penicillins Rash   Morphine And Codeine Other (See Comments)    Severe headache   Sulfa Antibiotics Other (See Comments)    Patient states mother had reaction while she was pregnant and patient was instructed not to take.     Patient Care Team: Mechele Claude, MD as PCP - General (Family Medicine) Ronne Binning Mardene Celeste, MD as Consulting Physician (Urology) Carman Ching, MD (Inactive) as Consulting Physician (Gastroenterology) [name], Valentino Hue, MD (Inactive) as Consulting Physician (Oncology)  Review of Systems  Constitutional: Negative.   HENT: Negative.    Eyes:  Negative for visual disturbance.  Respiratory:  Negative for cough and shortness of breath.   Cardiovascular:  Negative for chest pain and leg swelling.  Gastrointestinal:  Negative for abdominal pain, diarrhea, nausea and vomiting.  Genitourinary:  Positive for flank  pain. Negative for difficulty urinating.  Musculoskeletal:  Negative for arthralgias and myalgias.  Skin:  Negative for rash.  Neurological:  Negative for headaches.  Psychiatric/Behavioral:  Negative for sleep disturbance.     Review of Systems  Constitutional: Negative.   HENT: Negative.    Eyes:  Negative for visual disturbance.  Respiratory:  Negative for cough and shortness of breath.   Cardiovascular:  Negative for chest pain and leg swelling.  Gastrointestinal:  Negative for abdominal pain, diarrhea, nausea and vomiting.  Genitourinary:  Positive for flank pain. Negative for difficulty urinating.   Musculoskeletal:  Negative for arthralgias and myalgias.  Skin:  Negative for rash.  Neurological:  Negative for headaches.  Psychiatric/Behavioral:  Negative for sleep disturbance.        Objective  BP 139/79   Pulse 76   Temp (!) 97.2 F (36.2 C)   Ht 6' (1.829 m)   Wt 204 lb (92.5 kg)   SpO2 96%   BMI 27.67 kg/m  BP Readings from Last 3 Encounters:  04/14/23 139/79  01/19/22 (!) 140/85  01/17/21 134/78   Wt Readings from Last 3 Encounters:  04/14/23 204 lb (92.5 kg)  01/21/23 201 lb 3.2 oz (91.3 kg)  01/19/22 202 lb (91.6 kg)      Physical Exam Constitutional:      General: He is not in acute distress.    Appearance: He is well-developed.  HENT:     Head: Normocephalic and atraumatic.     Right Ear: External ear normal.     Left Ear: External ear normal.     Nose: Nose normal.  Eyes:     Conjunctiva/sclera: Conjunctivae normal.     Pupils: Pupils are equal, round, and reactive to light.  Cardiovascular:     Rate and Rhythm: Normal rate and regular rhythm.     Heart sounds: Normal heart sounds. No murmur heard. Pulmonary:     Effort: Pulmonary effort is normal. No respiratory distress.     Breath sounds: Normal breath sounds. No wheezing or rales.  Abdominal:     Palpations: Abdomen is soft.     Tenderness: There is no abdominal tenderness.  Musculoskeletal:        General: Normal range of motion.     Cervical back: Normal range of motion and neck supple.  Skin:    General: Skin is warm and dry.  Neurological:     Mental Status: He is alert and oriented to person, place, and time.     Deep Tendon Reflexes: Reflexes are normal and symmetric.  Psychiatric:        Behavior: Behavior normal.        Thought Content: Thought content normal.        Judgment: Judgment normal.    EKG, NSR, No ischemic changes. Normal axes, intervals and segments   Most recent functional status assessment:    04/14/2023    1:18 PM  In your present state of health, do  you have any difficulty performing the following activities:  Hearing? 0  Vision? 0  Difficulty concentrating or making decisions? 0  Walking or climbing stairs? 0  Dressing or bathing? 0  Doing errands, shopping? 0  Preparing Food and eating ? N  Using the Toilet? N  In the past six months, have you accidently leaked urine? N  Do you have problems with loss of bowel control? N  Managing your Medications? N  Managing your Finances? N  Housekeeping or managing your Housekeeping?  N   Most recent fall risk assessment:    04/14/2023    1:18 PM  Fall Risk   Falls in the past year? 0    Most recent depression screenings:    04/14/2023    1:18 PM 04/14/2023    1:17 PM  PHQ 2/9 Scores  PHQ - 2 Score 0 0   Most recent cognitive screening:    04/14/2023    1:20 PM  6CIT Screen  What Year? 0 points  What month? 0 points  What time? 0 points  Count back from 20 0 points  Months in reverse 0 points  Repeat phrase 4 points  Total Score 4 points   Most recent Audit-C alcohol use screening    04/14/2023    1:16 PM  Alcohol Use Disorder Test (AUDIT)  1. How often do you have a drink containing alcohol? 0  2. How many drinks containing alcohol do you have on a typical day when you are drinking? 0  3. How often do you have six or more drinks on one occasion? 0  AUDIT-C Score 0   A score of 3 or more in women, and 4 or more in men indicates increased risk for alcohol abuse, EXCEPT if all of the points are from question 1   Vision/Hearing Screen: No results found.    No results found for any visits on 04/14/23.    Assessment & Plan   Annual wellness visit done today including the all of the following: Reviewed patient's Family Medical History Reviewed and updated list of patient's medical providers Assessment of cognitive impairment was done Assessed patient's functional ability Established a written schedule for health screening services Health Risk Assessent Completed  and Reviewed  Exercise Activities and Dietary recommendations  Goals   None     Immunization History  Administered Date(s) Administered   Fluad Trivalent(High Dose 65+) 01/21/2023   Influenza Split 02/23/2012   Influenza,inj,Quad PF,6+ Mos 04/12/2014, 03/28/2020   Moderna Sars-Covid-2 Vaccination 07/31/2019, 08/28/2019, 04/12/2020, 03/18/2022   PNEUMOCOCCAL CONJUGATE-20 01/21/2023   Td 05/08/1997   Tdap 04/12/2014   Zoster Recombinant(Shingrix) 01/17/2021, 04/17/2021    Health Maintenance  Topic Date Due   COVID-19 Vaccine (5 - 2023-24 season) 04/30/2023 (Originally 01/10/2023)   DTaP/Tdap/Td (3 - Td or Tdap) 04/12/2024   Medicare Annual Wellness (AWV)  04/13/2024   Colonoscopy  04/17/2025   Pneumonia Vaccine 72+ Years old  Completed   Hepatitis C Screening  Completed   HIV Screening  Completed   Zoster Vaccines- Shingrix  Completed   HPV VACCINES  Aged Out   INFLUENZA VACCINE  Discontinued     Discussed health benefits of physical activity, and encouraged him to engage in regular exercise appropriate for his age and condition.    Problem List Items Addressed This Visit   None Visit Diagnoses     Welcome to Medicare preventive visit    -  Primary   Relevant Orders   PSA, total and free   Urinalysis   EKG 12-Lead (Completed)   US [employer] MEDICARE SCREENING       Return in about 6 months (around 10/13/2023).     Mechele Claude, MD

## 2023-04-15 LAB — PSA, TOTAL AND FREE
PSA, Free Pct: 29.7 %
PSA, Free: 0.95 ng/mL
Prostate Specific Ag, Serum: 3.2 ng/mL (ref 0.0–4.0)

## 2023-04-18 NOTE — Progress Notes (Signed)
Hello Prestan,  Your lab result is normal and/or stable.Some minor variations that are not significant are commonly marked abnormal, but do not represent any medical problem for you.  Best regards, Mechele Claude, M.D.

## 2023-04-21 ENCOUNTER — Ambulatory Visit (HOSPITAL_COMMUNITY)
Admission: RE | Admit: 2023-04-21 | Discharge: 2023-04-21 | Disposition: A | Discharge status: Home / Self Care | Payer: Medicare PPO | Source: Ambulatory Visit | Attending: Family Medicine | Admitting: Family Medicine

## 2023-04-21 DIAGNOSIS — Z0000 Encounter for general adult medical examination without abnormal findings: Secondary | ICD-10-CM | POA: Diagnosis present

## 2023-04-21 DIAGNOSIS — Z136 Encounter for screening for cardiovascular disorders: Secondary | ICD-10-CM | POA: Diagnosis not present

## 2023-08-03 DIAGNOSIS — X32XXXA Exposure to sunlight, initial encounter: Secondary | ICD-10-CM | POA: Diagnosis not present

## 2023-08-03 DIAGNOSIS — D225 Melanocytic nevi of trunk: Secondary | ICD-10-CM | POA: Diagnosis not present

## 2023-08-03 DIAGNOSIS — L820 Inflamed seborrheic keratosis: Secondary | ICD-10-CM | POA: Diagnosis not present

## 2023-08-03 DIAGNOSIS — L570 Actinic keratosis: Secondary | ICD-10-CM | POA: Diagnosis not present

## 2023-09-23 ENCOUNTER — Ambulatory Visit (INDEPENDENT_AMBULATORY_CARE_PROVIDER_SITE_OTHER): Payer: Self-pay | Admitting: Family Medicine

## 2023-09-23 ENCOUNTER — Encounter: Payer: Self-pay | Admitting: Family Medicine

## 2023-09-23 VITALS — BP 139/80 | HR 94 | Temp 97.9°F | Ht 72.0 in | Wt 200.0 lb

## 2023-09-23 DIAGNOSIS — I10 Essential (primary) hypertension: Secondary | ICD-10-CM

## 2023-09-23 MED ORDER — LISINOPRIL 10 MG PO TABS: 10.0000 mg | ORAL_TABLET | Freq: Every day | ORAL | 3 refills | Status: AC

## 2023-09-23 NOTE — Progress Notes (Signed)
 Subjective:  Patient ID: Kyle Gillespie, male    DOB: 02-Dec-1955  Age: 68 y.o. MRN: [id number]  CC: Hypertension (Bp going up and down. Pt reports this is ongoing for a while. Never drops low just high to normal. )   HPI JUEL MCCUNE presents for elevated BP. Home monitor reading same as ours today. BP at home up to 180 systolic. Checking  q AM and is in range. Readings through the day can be high even whe watching TV, rested and relaxed.  Some of his readings have been done shortly after exercise or under stress.     09/23/2023   11:31 AM 04/14/2023    1:18 PM 04/14/2023    1:17 PM  Depression screen PHQ 2/9  Decreased Interest 0 0 0  Down, Depressed, Hopeless 0 0 0  PHQ - 2 Score 0 0 0  Altered sleeping 0    Tired, decreased energy 0    Change in appetite 0    Feeling bad or failure about yourself  0    Trouble concentrating 0    Moving slowly or fidgety/restless 0    Suicidal thoughts 0    PHQ-9 Score 0    Difficult doing work/chores Not difficult at all      History Oleh has a past medical history of DDD (degenerative disc disease), Dry eyes, Macular degeneration, and Vitamin B 12 deficiency.   He has a past surgical history that includes Appendectomy.   His family history includes Alcoholism in his brother; Heart disease in his mother; Hepatitis C in his brother; Stroke in his father.He reports that he has never smoked. He has never used smokeless tobacco. He reports that he does not drink alcohol and does not use drugs.    ROS Review of Systems  Constitutional:  Negative for fever.  Respiratory:  Negative for shortness of breath.   Cardiovascular:  Negative for chest pain.  Musculoskeletal:  Negative for arthralgias.  Skin:  Negative for rash.    Objective:  BP 139/80   Pulse 94   Temp 97.9 F (36.6 C)   Ht 6' (1.829 m)   Wt 200 lb (90.7 kg)   SpO2 95%   BMI 27.12 kg/m   BP Readings from Last 3 Encounters:  09/23/23 139/80  04/14/23 139/79  01/19/22 (!)  140/85    Wt Readings from Last 3 Encounters:  09/23/23 200 lb (90.7 kg)  04/14/23 204 lb (92.5 kg)  01/21/23 201 lb 3.2 oz (91.3 kg)     Physical Exam Vitals reviewed.  Constitutional:      Appearance: He is well-developed.  HENT:     Head: Normocephalic and atraumatic.     Right Ear: External ear normal.     Left Ear: External ear normal.     Mouth/Throat:     Pharynx: No oropharyngeal exudate or posterior oropharyngeal erythema.  Eyes:     Pupils: Pupils are equal, round, and reactive to light.  Cardiovascular:     Rate and Rhythm: Normal rate and regular rhythm.     Heart sounds: No murmur heard. Pulmonary:     Effort: No respiratory distress.     Breath sounds: Normal breath sounds.  Musculoskeletal:     Cervical back: Normal range of motion and neck supple.  Neurological:     Mental Status: He is alert and oriented to person, place, and time.     Assessment & Plan:  Primary hypertension -     CMP14+EGFR  Other orders -     Lisinopril; Take 1 tablet (10 mg total) by mouth daily.  Dispense: 90 tablet; Refill: 3   New onset of primary hypertension.  Medication ordered.  I asked patient to start checking his blood pressure regularly using the technique discussed and using an arm rather than wrist monitor.  Should the readings be well-controlled we will consider discontinuing the lisinopril.  Follow-up: Return in about 6 weeks (around 11/04/2023) for hypertension.  Roise Cleaver, M.D.

## 2023-09-24 LAB — CMP14+EGFR
ALT: 35 IU/L (ref 0–44)
AST: 25 IU/L (ref 0–40)
Albumin: 4.7 g/dL (ref 3.9–4.9)
Alkaline Phosphatase: 95 IU/L (ref 44–121)
BUN/Creatinine Ratio: 11 (ref 10–24)
BUN: 10 mg/dL (ref 8–27)
Bilirubin Total: 0.6 mg/dL (ref 0.0–1.2)
CO2: 26 mmol/L (ref 20–29)
Calcium: 10.1 mg/dL (ref 8.6–10.2)
Chloride: 101 mmol/L (ref 96–106)
Creatinine, Ser: 0.91 mg/dL (ref 0.76–1.27)
Globulin, Total: 2.6 g/dL (ref 1.5–4.5)
Glucose: 82 mg/dL (ref 70–99)
Potassium: 4.7 mmol/L (ref 3.5–5.2)
Sodium: 141 mmol/L (ref 134–144)
Total Protein: 7.3 g/dL (ref 6.0–8.5)
eGFR: 94 mL/min/{1.73_m2} (ref >59)

## 2023-09-26 ENCOUNTER — Ambulatory Visit: Payer: Self-pay | Admitting: Family Medicine

## 2023-09-26 NOTE — Progress Notes (Signed)
 Hello Prestan,  Your lab result is normal and/or stable.Some minor variations that are not significant are commonly marked abnormal, but do not represent any medical problem for you.  Best regards, Mechele Claude, M.D.

## 2023-11-04 ENCOUNTER — Ambulatory Visit: Admitting: Family Medicine

## 2023-11-04 ENCOUNTER — Encounter: Payer: Self-pay | Admitting: Family Medicine

## 2023-11-04 VITALS — BP 118/71 | HR 69 | Temp 98.3°F | Ht 72.0 in | Wt 198.0 lb

## 2023-11-04 DIAGNOSIS — I10 Essential (primary) hypertension: Secondary | ICD-10-CM | POA: Diagnosis not present

## 2023-11-04 NOTE — Progress Notes (Signed)
 Subjective:  Patient ID: Kyle Gillespie, male    DOB: [birth year]  Age: 68 y.o. MRN: [id number]  CC: Hypertension (Pt states that BP has been good at home. Brought readings with him. )   HPI BRANDON WIECHMAN presents for  presents for  follow-up of hypertension. Patient has no history of headache chest pain or shortness of breath or recent cough. Patient also denies symptoms of TIA such as focal numbness or weakness. Patient denies side effects from medication. States taking it regularly.      09/23/2023   11:31 AM 04/14/2023    1:18 PM 04/14/2023    1:17 PM  Depression screen PHQ 2/9  Decreased Interest 0 0 0  Down, Depressed, Hopeless 0 0 0  PHQ - 2 Score 0 0 0  Altered sleeping 0    Tired, decreased energy 0    Change in appetite 0    Feeling bad or failure about yourself  0    Trouble concentrating 0    Moving slowly or fidgety/restless 0    Suicidal thoughts 0    PHQ-9 Score 0    Difficult doing work/chores Not difficult at all      History Crews has a past medical history of DDD (degenerative disc disease), Dry eyes, Macular degeneration, and Vitamin B 12 deficiency.   He has a past surgical history that includes Appendectomy.   His family history includes Alcoholism in his brother; Heart disease in his mother; Hepatitis C in his brother; Stroke in his father.He reports that he has never smoked. He has never used smokeless tobacco. He reports that he does not drink alcohol and does not use drugs.    ROS Review of Systems  Constitutional:  Negative for fever.  Respiratory:  Negative for shortness of breath.   Cardiovascular:  Negative for chest pain.  Musculoskeletal:  Negative for arthralgias.  Skin:  Negative for rash.    Objective:  BP 118/71   Pulse 69   Temp 98.3 F (36.8 C)   Ht 6' (1.829 m)   Wt 198 lb (89.8 kg)   SpO2 97%   BMI 26.85 kg/m   BP Readings from Last 3 Encounters:  11/04/23 118/71  09/23/23 139/80  04/14/23 139/79    Wt Readings from  Last 3 Encounters:  11/04/23 198 lb (89.8 kg)  09/23/23 200 lb (90.7 kg)  04/14/23 204 lb (92.5 kg)     Physical Exam Vitals reviewed.  Constitutional:      Appearance: He is well-developed.  HENT:     Head: Normocephalic and atraumatic.     Right Ear: External ear normal.     Left Ear: External ear normal.     Mouth/Throat:     Pharynx: No oropharyngeal exudate or posterior oropharyngeal erythema.   Eyes:     Pupils: Pupils are equal, round, and reactive to light.    Cardiovascular:     Rate and Rhythm: Normal rate and regular rhythm.     Heart sounds: No murmur heard. Pulmonary:     Effort: No respiratory distress.     Breath sounds: Normal breath sounds.   Musculoskeletal:     Cervical back: Normal range of motion and neck supple.   Neurological:     Mental Status: He is alert and oriented to person, place, and time.      Assessment & Plan:  Primary hypertension     Follow-up: Return in about 6 months (around 05/05/2024) for [facility] physical.  Butler  Shakima Nisley, M.D.

## 2024-01-13 ENCOUNTER — Other Ambulatory Visit: Payer: Self-pay | Admitting: Family Medicine

## 2024-01-13 ENCOUNTER — Encounter: Payer: Self-pay | Admitting: Family Medicine

## 2024-01-13 DIAGNOSIS — Z0000 Encounter for general adult medical examination without abnormal findings: Secondary | ICD-10-CM

## 2024-01-13 DIAGNOSIS — E538 Deficiency of other specified B group vitamins: Secondary | ICD-10-CM

## 2024-01-13 DIAGNOSIS — E782 Mixed hyperlipidemia: Secondary | ICD-10-CM

## 2024-01-13 DIAGNOSIS — Z125 Encounter for screening for malignant neoplasm of prostate: Secondary | ICD-10-CM

## 2024-01-13 DIAGNOSIS — I10 Essential (primary) hypertension: Secondary | ICD-10-CM

## 2024-01-13 DIAGNOSIS — E559 Vitamin D deficiency, unspecified: Secondary | ICD-10-CM

## 2024-01-13 NOTE — Telephone Encounter (Signed)
 I ordered the labs e needs. However, his CPE is on 9/15 at 10:55, NOT 9/12. Please make sure he is aware

## 2024-01-17 ENCOUNTER — Other Ambulatory Visit

## 2024-01-17 DIAGNOSIS — Z0000 Encounter for general adult medical examination without abnormal findings: Secondary | ICD-10-CM

## 2024-01-17 DIAGNOSIS — E538 Deficiency of other specified B group vitamins: Secondary | ICD-10-CM | POA: Diagnosis not present

## 2024-01-17 DIAGNOSIS — E782 Mixed hyperlipidemia: Secondary | ICD-10-CM | POA: Diagnosis not present

## 2024-01-17 DIAGNOSIS — Z125 Encounter for screening for malignant neoplasm of prostate: Secondary | ICD-10-CM | POA: Diagnosis not present

## 2024-01-17 DIAGNOSIS — I10 Essential (primary) hypertension: Secondary | ICD-10-CM

## 2024-01-17 DIAGNOSIS — E559 Vitamin D deficiency, unspecified: Secondary | ICD-10-CM

## 2024-01-17 LAB — URINALYSIS
Bilirubin, UA: NEGATIVE
Glucose, UA: NEGATIVE
Leukocytes,UA: NEGATIVE
Nitrite, UA: NEGATIVE
Protein,UA: NEGATIVE
RBC, UA: NEGATIVE
Specific Gravity, UA: 1.015 (ref 1.005–1.030)
Urobilinogen, Ur: 1 mg/dL (ref 0.2–1.0)
pH, UA: 7 (ref 5.0–7.5)

## 2024-01-18 ENCOUNTER — Ambulatory Visit: Payer: Self-pay | Admitting: Family Medicine

## 2024-01-18 DIAGNOSIS — E538 Deficiency of other specified B group vitamins: Secondary | ICD-10-CM

## 2024-01-18 LAB — CMP14+EGFR
ALT: 36 IU/L (ref 0–44)
AST: 27 IU/L (ref 0–40)
Albumin: 4.5 g/dL (ref 3.9–4.9)
Alkaline Phosphatase: 87 IU/L (ref 44–121)
BUN/Creatinine Ratio: 10 (ref 10–24)
BUN: 10 mg/dL (ref 8–27)
Bilirubin Total: 1 mg/dL (ref 0.0–1.2)
CO2: 25 mmol/L (ref 20–29)
Calcium: 9.6 mg/dL (ref 8.6–10.2)
Chloride: 104 mmol/L (ref 96–106)
Creatinine, Ser: 1.01 mg/dL (ref 0.76–1.27)
Globulin, Total: 2.4 g/dL (ref 1.5–4.5)
Glucose: 91 mg/dL (ref 70–99)
Potassium: 5.3 mmol/L — ABNORMAL HIGH (ref 3.5–5.2)
Sodium: 141 mmol/L (ref 134–144)
Total Protein: 6.9 g/dL (ref 6.0–8.5)
eGFR: 82 mL/min/1.73 (ref >59)

## 2024-01-18 LAB — CBC WITH DIFFERENTIAL/PLATELET
Basophils Absolute: 0 x10E3/uL (ref 0.0–0.2)
Basos: 0 %
EOS (ABSOLUTE): 0.2 x10E3/uL (ref 0.0–0.4)
Eos: 4 %
Hematocrit: 50.8 % (ref 37.5–51.0)
Hemoglobin: 16.9 g/dL (ref 13.0–17.7)
Immature Grans (Abs): 0 x10E3/uL (ref 0.0–0.1)
Immature Granulocytes: 0 %
Lymphocytes Absolute: 1.2 x10E3/uL (ref 0.7–3.1)
Lymphs: 27 %
MCH: 30.6 pg (ref 26.6–33.0)
MCHC: 33.3 g/dL (ref 31.5–35.7)
MCV: 92 fL (ref 79–97)
Monocytes Absolute: 0.6 x10E3/uL (ref 0.1–0.9)
Monocytes: 14 %
Neutrophils Absolute: 2.4 x10E3/uL (ref 1.4–7.0)
Neutrophils: 55 %
Platelets: 216 x10E3/uL (ref 150–450)
RBC: 5.53 x10E6/uL (ref 4.14–5.80)
RDW: 13.2 % (ref 11.6–15.4)
WBC: 4.4 x10E3/uL (ref 3.4–10.8)

## 2024-01-18 LAB — LIPID PANEL
Chol/HDL Ratio: 4 ratio (ref 0.0–5.0)
Cholesterol, Total: 181 mg/dL (ref 100–199)
HDL: 45 mg/dL (ref >39)
LDL Chol Calc (NIH): 121 mg/dL — ABNORMAL HIGH (ref 0–99)
Triglycerides: 79 mg/dL (ref 0–149)
VLDL Cholesterol Cal: 15 mg/dL (ref 5–40)

## 2024-01-18 LAB — PSA, TOTAL AND FREE
PSA, Free Pct: 29.4
PSA, Free: 0.94 ng/mL
Prostate Specific Ag, Serum: 3.2 ng/mL (ref 0.0–4.0)

## 2024-01-18 LAB — VITAMIN B12: Vitamin B-12: 517 pg/mL (ref 232–1245)

## 2024-01-18 LAB — VITAMIN D 25 HYDROXY (VIT D DEFICIENCY, FRACTURES): Vit D, 25-Hydroxy: 22.4 ng/mL — AB (ref 30.0–100.0)

## 2024-01-18 NOTE — Progress Notes (Signed)
 Dear Kyle Gillespie, Your Vitamin D  is  low. You need a prescription strength supplement I will send that in for you. Nurse, if at all possible, could you send in a prescription for the patient for vitamin D  50,000 units, 1 p.o. weekly #13 with 3 refills? Many thanks, WS

## 2024-01-20 MED ORDER — VITAMIN D (ERGOCALCIFEROL) 1.25 MG (50000 UNIT) PO CAPS: 50000.0000 [IU] | ORAL_CAPSULE | ORAL | 1 refills | Status: AC

## 2024-01-20 NOTE — Progress Notes (Signed)
 Vitamin D  Rx ordered

## 2024-01-24 ENCOUNTER — Encounter: Payer: Self-pay | Admitting: Family Medicine

## 2024-01-24 ENCOUNTER — Ambulatory Visit: Payer: Medicare PPO | Admitting: Family Medicine

## 2024-01-24 VITALS — BP 124/67 | HR 83 | Temp 98.3°F | Ht 72.0 in | Wt 199.0 lb

## 2024-01-24 DIAGNOSIS — Z0001 Encounter for general adult medical examination with abnormal findings: Secondary | ICD-10-CM | POA: Diagnosis not present

## 2024-01-24 DIAGNOSIS — R011 Cardiac murmur, unspecified: Secondary | ICD-10-CM | POA: Diagnosis not present

## 2024-01-24 DIAGNOSIS — E559 Vitamin D deficiency, unspecified: Secondary | ICD-10-CM | POA: Diagnosis not present

## 2024-01-24 DIAGNOSIS — E538 Deficiency of other specified B group vitamins: Secondary | ICD-10-CM | POA: Diagnosis not present

## 2024-01-24 DIAGNOSIS — I10 Essential (primary) hypertension: Secondary | ICD-10-CM | POA: Diagnosis not present

## 2024-01-24 DIAGNOSIS — Z0000 Encounter for general adult medical examination without abnormal findings: Secondary | ICD-10-CM

## 2024-01-24 NOTE — Progress Notes (Signed)
 Subjective:  Patient ID: Kyle Gillespie, male    DOB: [birth year]  Age: 68 y.o. MRN: [id number]  CC: Annual Exam   HPI  Discussed the use of AI scribe software for clinical note transcription with the patient, who gave verbal consent to proceed.  History of Present Illness Kyle Gillespie is a 68 year old male who presents for an annual physical exam.  He has experienced right shoulder pain for 10 to 15 years, with cortisone injections providing relief. Recently, he noted tingling and burning sensations in the shoulder, which resolved spontaneously. He cannot raise his arm above 90 degrees without pain, describing a 'bite' sensation. He has not sought recent treatment as symptoms improved on his own.  He is currently taking lisinopril  for hypertension management. He monitors his blood pressure with two meters, reporting systolic readings from 120 to 862 and diastolic readings from 58 to 85. He is satisfied with these readings and checks his blood pressure less frequently due to stability.  He has a history of vitamin D  deficiency, identified in recent blood work. He has started taking vitamin D  supplements, specifically 25 micrograms (1000 IU) of vitamin D3 weekly, alongside B12 injections every two weeks. He also takes multivitamins.  He experiences wax buildup in his left ear, which he manages by washing it out with peroxide and a syringe.  He has a history of a heart murmur since age 29, which has not caused symptoms such as shortness of breath or dizziness. He has not had recent cardiac evaluations and does not express concern about the murmur.  No gastrointestinal issues such as diarrhea, constipation, heartburn, or abdominal pain. He reports a good appetite. He has a history of kidney stones and was previously advised to avoid milk and tomatoes.          09/23/2023   11:31 AM 04/14/2023    1:18 PM 04/14/2023    1:17 PM  Depression screen PHQ 2/9  Decreased Interest 0 0 0  Down,  Depressed, Hopeless 0 0 0  PHQ - 2 Score 0 0 0  Altered sleeping 0    Tired, decreased energy 0    Change in appetite 0    Feeling bad or failure about yourself  0    Trouble concentrating 0    Moving slowly or fidgety/restless 0    Suicidal thoughts 0    PHQ-9 Score 0    Difficult doing work/chores Not difficult at all      History Kyle Gillespie has a past medical history of Allergy, Anemia, DDD (degenerative disc disease), Dry eyes, Macular degeneration, and Vitamin B 12 deficiency.   He has a past surgical history that includes Appendectomy.   His family history includes Alcoholism in his brother; Arthritis in his mother; Hearing loss in his father; Heart disease in his mother; Hepatitis C in his brother; Stroke in his father; Varicose Veins in his mother; Vision loss in his father.He reports that he has never smoked. He has never used smokeless tobacco. He reports that he does not drink alcohol and does not use drugs.    ROS Review of Systems  Constitutional:  Negative for activity change, fatigue and unexpected weight change.  HENT:  Negative for congestion, ear pain, hearing loss, postnasal drip and trouble swallowing.   Eyes:  Negative for pain and visual disturbance.  Respiratory:  Negative for cough, chest tightness and shortness of breath.   Cardiovascular:  Negative for chest pain, palpitations and leg swelling.  Gastrointestinal:  Negative for abdominal distention, abdominal pain, blood in stool, constipation, diarrhea, nausea and vomiting.  Endocrine: Negative for cold intolerance, heat intolerance and polydipsia.  Genitourinary:  Negative for difficulty urinating, dysuria, flank pain, frequency and urgency.  Musculoskeletal:  Positive for arthralgias (RIGHT SHOULDER responded to cortisone shots). Negative for joint swelling.  Skin:  Negative for color change, rash and wound.  Neurological:  Negative for dizziness, syncope, speech difficulty, weakness, light-headedness,  numbness and headaches.  Hematological:  Does not bruise/bleed easily.  Psychiatric/Behavioral:  Negative for confusion, decreased concentration, dysphoric mood and sleep disturbance. The patient is not nervous/anxious.     Objective:  BP 124/67   Pulse 83   Temp 98.3 F (36.8 C)   Ht 6' (1.829 m)   Wt 199 lb (90.3 kg)   SpO2 96%   BMI 26.99 kg/m   BP Readings from Last 3 Encounters:  01/24/24 124/67  11/04/23 118/71  09/23/23 139/80    Wt Readings from Last 3 Encounters:  01/24/24 199 lb (90.3 kg)  11/04/23 198 lb (89.8 kg)  09/23/23 200 lb (90.7 kg)     Physical Exam Constitutional:      Appearance: He is well-developed.  HENT:     Head: Normocephalic and atraumatic.  Eyes:     Pupils: Pupils are equal, round, and reactive to light.  Neck:     Thyroid: No thyromegaly.     Trachea: No tracheal deviation.  Cardiovascular:     Rate and Rhythm: Normal rate and regular rhythm.     Heart sounds: Normal heart sounds. No murmur heard.    No friction rub. No gallop.  Pulmonary:     Breath sounds: Normal breath sounds. No wheezing or rales.  Abdominal:     General: Bowel sounds are normal. There is no distension.     Palpations: Abdomen is soft. There is no mass.     Tenderness: There is no abdominal tenderness.     Hernia: There is no hernia in the left inguinal area.  Genitourinary:    Penis: Normal.      Testes: Normal.  Musculoskeletal:        General: Normal range of motion.     Cervical back: Normal range of motion.  Lymphadenopathy:     Cervical: No cervical adenopathy.  Skin:    General: Skin is warm and dry.  Neurological:     Mental Status: He is alert and oriented to person, place, and time.    Results for orders placed or performed in visit on 01/17/24  CBC with Differential/Platelet   Collection Time: 01/17/24 10:34 AM  Result Value Ref Range   WBC 4.4 3.4 - 10.8 x10E3/uL   RBC 5.53 4.14 - 5.80 x10E6/uL   Hemoglobin 16.9 13.0 - 17.7 g/dL    Hematocrit 49.1 62.4 - 51.0 %   MCV 92 79 - 97 fL   MCH 30.6 26.6 - 33.0 pg   MCHC 33.3 31.5 - 35.7 g/dL   RDW 86.7 88.3 - 84.5 %   Platelets 216 150 - 450 x10E3/uL   Neutrophils 55 Not Estab. %   Lymphs 27 Not Estab. %   Monocytes 14 Not Estab. %   Eos 4 Not Estab. %   Basos 0 Not Estab. %   Neutrophils Absolute 2.4 1.4 - 7.0 x10E3/uL   Lymphocytes Absolute 1.2 0.7 - 3.1 x10E3/uL   Monocytes Absolute 0.6 0.1 - 0.9 x10E3/uL   EOS (ABSOLUTE) 0.2 0.0 - 0.4 x10E3/uL   Basophils Absolute  0.0 0.0 - 0.2 x10E3/uL   Immature Granulocytes 0 Not Estab. %   Immature Grans (Abs) 0.0 0.0 - 0.1 x10E3/uL  CMP14+EGFR   Collection Time: 01/17/24 10:34 AM  Result Value Ref Range   Glucose 91 70 - 99 mg/dL   BUN 10 8 - 27 mg/dL   Creatinine, Ser 8.98 0.76 - 1.27 mg/dL   eGFR 82 >40 fO/fpw/8.26   BUN/Creatinine Ratio 10 10 - 24   Sodium 141 134 - 144 mmol/L   Potassium 5.3 (H) 3.5 - 5.2 mmol/L   Chloride 104 96 - 106 mmol/L   CO2 25 20 - 29 mmol/L   Calcium  9.6 8.6 - 10.2 mg/dL   Total Protein 6.9 6.0 - 8.5 g/dL   Albumin 4.5 3.9 - 4.9 g/dL   Globulin, Total 2.4 1.5 - 4.5 g/dL   Bilirubin Total 1.0 0.0 - 1.2 mg/dL   Alkaline Phosphatase 87 44 - 121 IU/L   AST 27 0 - 40 IU/L   ALT 36 0 - 44 IU/L  Lipid panel   Collection Time: 01/17/24 10:34 AM  Result Value Ref Range   Cholesterol, Total 181 100 - 199 mg/dL   Triglycerides 79 0 - 149 mg/dL   HDL 45 >60 mg/dL   VLDL Cholesterol Cal 15 5 - 40 mg/dL   LDL Chol Calc (NIH) 878 (H) 0 - 99 mg/dL   Chol/HDL Ratio 4.0 0.0 - 5.0 ratio  PSA, total and free   Collection Time: 01/17/24 10:34 AM  Result Value Ref Range   Prostate Specific Ag, Serum 3.2 0.0 - 4.0 ng/mL   PSA, Free 0.94 N/A ng/mL   PSA, Free Pct 29.4 %  VITAMIN D  25 Hydroxy (Vit-D Deficiency, Fractures)   Collection Time: 01/17/24 10:34 AM  Result Value Ref Range   Vit D, 25-Hydroxy 22.4 (L) 30.0 - 100.0 ng/mL  Vitamin B12   Collection Time: 01/17/24 10:34 AM  Result Value Ref  Range   Vitamin B-12 517 232 - 1,245 pg/mL  Urinalysis   Collection Time: 01/17/24 10:48 AM  Result Value Ref Range   Specific Gravity, UA 1.015 1.005 - 1.030   pH, UA 7.0 5.0 - 7.5   Color, UA Yellow Yellow   Appearance Ur Clear Clear   Leukocytes,UA Negative Negative   Protein,UA Negative Negative/Trace   Glucose, UA Negative Negative   Ketones, UA Trace (A) Negative   RBC, UA Negative Negative   Bilirubin, UA Negative Negative   Urobilinogen, Ur 1.0 0.2 - 1.0 mg/dL   Nitrite, UA Negative Negative     Assessment & Plan:  Well adult exam  Primary hypertension  Vitamin B 12 deficiency  Vitamin D  deficiency  Murmur, cardiac    Assessment and Plan Assessment & Plan Adult Wellness Visit   Routine adult wellness visit focused on general health maintenance, including immunizations and vitamin supplementation. Blood work reviewed, including vitamin D  and cholesterol levels. Advise obtaining COVID vaccine at a pharmacy and schedule tetanus booster for December 2025. Discuss vitamin supplementation and dietary habits.  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 Recent episode resolved spontaneously, possibly due to cartilage damage. Advised against lifting over shoulder height and discussed potential benefits of physical therapy. Consider referral to [facility] or [facility] for further evaluation.  Hypertension   Hypertension is well-controlled with lisinopril . Blood pressure readings show systolic range of 100-137 and  diastolic range of 58-85. Continue lisinopril  and monitor blood pressure regularly. Discuss dietary modifications to reduce salt intake, particularly from bacon and ham.  Vitamin D  deficiency   Vitamin D  deficiency identified in recent blood work. Started on vitamin D  supplementation. Discussed dietary sources and sun exposure,  explaining the risk of skin cancer with excessive sun exposure. Continue vitamin D  supplementation and recheck levels in 6 months. Discuss safe sun exposure practices.  Cardiac murmur   Long-standing cardiac murmur since age 74 with no symptoms such as shortness of breath or dizziness. Possible leaky aortic valve. No current need for further evaluation unless symptoms develop. Monitor for symptoms like fainting, dizziness, or shortness of breath. Consider echocardiogram if symptoms develop.  Ear wax buildup   Reports wax buildup in the left ear. Uses peroxide and syringe for cleaning. Advised against using peroxide due to potential damage. Recommend using Debrox to soften wax for easier removal.       Follow-up: Return in about 6 months (around 07/23/2024).  Butler Der, M.D.

## 2024-03-09 ENCOUNTER — Other Ambulatory Visit: Payer: Self-pay | Admitting: Family Medicine

## 2024-05-02 NOTE — Progress Notes (Signed)
 Pharmacy Quality Measure Review  This patient is appearing on a report for being at risk of failing the adherence measure for hypertension (ACEi/ARB) medications this calendar year.   Medication: Lisinopril  10 mg Last fill date: 03/13/24 for 90 day supply, sold 03/15/24  Insurance report was not up to date. No action needed at this time.   Jenkins Graces, PharmD PGY1 Pharmacy Resident

## 2024-05-30 ENCOUNTER — Other Ambulatory Visit: Payer: Self-pay | Admitting: Family Medicine

## 2024-07-24 ENCOUNTER — Ambulatory Visit: Payer: Self-pay | Admitting: Family Medicine
